# Patient Record
Sex: Male | Born: 2000 | Race: Black or African American | Hispanic: No | Marital: Single | Smoking: Never smoker
Health system: Southern US, Community
[De-identification: ages and names within clinical notes are randomized; demographics above are authoritative.]

## PROBLEM LIST (undated history)

## (undated) DIAGNOSIS — E663 Overweight: Secondary | ICD-10-CM

## (undated) DIAGNOSIS — E739 Lactose intolerance, unspecified: Secondary | ICD-10-CM

## (undated) DIAGNOSIS — Z9109 Other allergy status, other than to drugs and biological substances: Secondary | ICD-10-CM

## (undated) HISTORY — DX: Lactose intolerance, unspecified: E73.9

## (undated) HISTORY — DX: Other allergy status, other than to drugs and biological substances: Z91.09

## (undated) HISTORY — DX: Overweight: E66.3

## (undated) HISTORY — PX: TONSILLECTOMY: SUR1361

---
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
## 2022-03-12 ENCOUNTER — Encounter: Payer: Self-pay | Admitting: Emergency Medicine

## 2022-03-12 ENCOUNTER — Ambulatory Visit
Admission: EM | Admit: 2022-03-12 | Discharge: 2022-03-12 | Disposition: A | Discharge status: Home / Self Care | Payer: Medicaid Other | Attending: Family Medicine | Admitting: Family Medicine

## 2022-03-12 DIAGNOSIS — H65193 Other acute nonsuppurative otitis media, bilateral: Secondary | ICD-10-CM

## 2022-03-12 MED ORDER — PREDNISONE 50 MG PO TABS: ORAL_TABLET | ORAL | 0 refills | Status: DC

## 2022-03-12 MED ORDER — PSEUDOEPHEDRINE HCL ER 120 MG PO TB12: 120.0000 mg | ORAL_TABLET | Freq: Two times a day (BID) | ORAL | 0 refills | Status: DC | PRN

## 2022-03-12 MED ORDER — FLUTICASONE PROPIONATE 50 MCG/ACT NA SUSP: 1.0000 | Freq: Two times a day (BID) | NASAL | 2 refills | Status: DC

## 2022-03-12 NOTE — ED Triage Notes (Signed)
Right ear pain since Sunday.  States can't hear our of left ear today.   ?

## 2022-03-12 NOTE — ED Provider Notes (Signed)
?[facility] ? ? ? ?CSN: [id number] ?Arrival date & time: 03/12/22  [id number] ? ? ?  ? ?History   ?Chief Complaint ?No chief complaint on file. ? ? ?HPI ?Antonio [name] is a 21 y.o. male.  ? ?Presenting today with bilateral ear pain, pressure, muffled hearing for the past 2 days.  Denies significant congestion, sore throat, cough, ear drainage, fevers.  Not trying anything over-the-counter for symptoms.  History of seasonal allergies not currently on anything for this. ? ? ?Past Medical History:  ?Diagnosis Date  ? Environmental allergies 03/25/2013  ? Overweight 05/25/2013  ? ? ?Patient Active Problem List  ? Diagnosis Date Noted  ? Encounter for well child check without abnormal findings 02/23/2018  ? Overweight 05/25/2013  ? Environmental allergies 03/25/2013  ? ? ?Past Surgical History:  ?Procedure Laterality Date  ? TONSILLECTOMY    ? ? ? ? ? ?Home Medications   ? ?Prior to Admission medications   ?Medication Sig Start Date End Date Taking? Authorizing Provider  ?fluticasone (FLONASE) 50 MCG/ACT nasal spray Place 1 spray into both nostrils 2 (two) times daily. 03/12/22  Yes Volney American, PA-C  ?predniSONE (DELTASONE) 50 MG tablet Take 1 tablet with breakfast daily x  days 03/12/22  Yes Volney American, PA-C  ?pseudoephedrine (SUDAFED 12 HOUR) 120 MG 12 hr tablet Take 1 tablet (120 mg total) by mouth every 12 (twelve) hours as needed for congestion. 03/12/22  Yes Volney American, PA-C  ?albuterol (PROVENTIL HFA;VENTOLIN HFA) 108 (90 Base) MCG/ACT inhaler Inhale 2 puffs into the lungs every 4 (four) hours as needed for wheezing or shortness of breath (or coughing). [id number]   Delora Fuel, MD  ?amoxicillin (AMOXIL) 875 MG tablet Take 1 tablet (875 mg total) by mouth 2 (two) times daily. 09/03/21   Jaynee Eagles, PA-C  ?benzonatate (TESSALON) 100 MG capsule Take 1 capsule (100 mg total) by mouth every 8 (eight) hours. 08/18/20   Emerson Monte, FNP  ?cetirizine (ZYRTEC ALLERGY) 10 MG tablet  Take 1 tablet (10 mg total) by mouth daily. 09/03/21   Jaynee Eagles, PA-C  ?fluticasone (FLONASE) 50 MCG/ACT nasal spray Place 1 spray into both nostrils daily for 14 days. 08/18/20 09/01/20  Emerson Monte, FNP  ?loperamide (IMODIUM) 2 MG capsule Take 1 capsule (2 mg total) by mouth 2 (two) times daily as needed for diarrhea or loose stools. 09/17/21   Jaynee Eagles, PA-C  ?ondansetron (ZOFRAN-ODT) 8 MG disintegrating tablet Take 1 tablet (8 mg total) by mouth every 8 (eight) hours as needed for nausea or vomiting. 09/17/21   Jaynee Eagles, PA-C  ?pseudoephedrine (SUDAFED) 30 MG tablet Take 1 tablet (30 mg total) by mouth every 8 (eight) hours as needed for congestion. 09/03/21   Jaynee Eagles, PA-C  ? ? ?Family History ?Family History  ?Problem Relation Age of Onset  ? Healthy Mother   ? Healthy Father   ? Healthy Sister   ? Hypertension Paternal Grandmother   ? Cancer Paternal Grandfather   ?     prostate  ? ? ?Social History ?Social History  ? ?Tobacco Use  ? Smoking status: Never  ?  Passive exposure: Yes  ? Smokeless tobacco: Never  ?Vaping Use  ? Vaping Use: Never used  ?Substance Use Topics  ? Alcohol use: No  ? Drug use: No  ? ? ? ?Allergies   ?Patient has no known allergies. ? ? ?Review of Systems ?Review of Systems ?Per HPI ? ?Physical Exam ?Triage Vital  Signs ?ED Triage Vitals  ?Enc Vitals Group  ?   BP 03/12/22 0948 (!) 135/91  ?   Pulse Rate 03/12/22 0948 (!) 57  ?   Resp 03/12/22 0948 18  ?   Temp 03/12/22 0948 98.2 ?F (36.8 ?C)  ?   Temp Source 03/12/22 0948 Oral  ?   SpO2 03/12/22 0948 98 %  ?   Weight --   ?   Height --   ?   Head Circumference --   ?   Peak Flow --   ?   Pain Score 03/12/22 0949 0  ?   Pain Loc --   ?   Pain Edu? --   ?   Excl. in [place]? --   ? ?No data found. ? ?Updated Vital Signs ?BP (!) 135/91 (BP Location: Right Arm)   Pulse (!) 57   Temp 98.2 ?F (36.8 ?C) (Oral)   Resp 18   SpO2 98%  ? ?Visual Acuity ?Right Eye Distance:   ?Left Eye Distance:   ?Bilateral Distance:   ? ?Right Eye  Near:   ?Left Eye Near:    ?Bilateral Near:    ? ?Physical Exam ?Vitals and nursing note reviewed.  ?Constitutional:   ?   Appearance: He is well-developed.  ?HENT:  ?   Head: Atraumatic.  ?   Right Ear: External ear normal.  ?   Left Ear: External ear normal.  ?   Ears:  ?   Comments: Bilateral middle ear effusion ?   Nose: Rhinorrhea present.  ?   Mouth/Throat:  ?   Mouth: Mucous membranes are moist.  ?   Pharynx: No oropharyngeal exudate.  ?Eyes:  ?   Conjunctiva/sclera: Conjunctivae normal.  ?   Pupils: Pupils are equal, round, and reactive to light.  ?Cardiovascular:  ?   Rate and Rhythm: Normal rate and regular rhythm.  ?Pulmonary:  ?   Effort: Pulmonary effort is normal. No respiratory distress.  ?   Breath sounds: No wheezing or rales.  ?Musculoskeletal:     ?   General: Normal range of motion.  ?   Cervical back: Normal range of motion and neck supple.  ?Lymphadenopathy:  ?   Cervical: No cervical adenopathy.  ?Skin: ?   General: Skin is warm and dry.  ?Neurological:  ?   Mental Status: He is alert and oriented to person, place, and time.  ?Psychiatric:     ?   Behavior: Behavior normal.  ? ?UC Treatments / Results  ?Labs ?(all labs ordered are listed, but only abnormal results are displayed) ?Labs Reviewed - No data to display ? ?EKG ? ? ?Radiology ?No results found. ? ?Procedures ?Procedures (including critical care time) ? ?Medications Ordered in UC ?Medications - No data to display ? ?Initial Impression / Assessment and Plan / UC Course  ?I have reviewed the triage vital signs and the nursing notes. ? ?Pertinent labs & imaging results that were available during my care of the patient were reviewed by me and considered in my medical decision making (see chart for details). ? ?  ? ?Treat with short burst of prednisone, Sudafed, Flonase and discussed importance of daily allergy regimen.  Return for acutely worsening symptoms. ? ?Final Clinical Impressions(s) / UC Diagnoses  ? ?Final diagnoses:  ?Acute MEE  (middle ear effusion), bilateral  ? ?Discharge Instructions   ?None ?  ? ?ED Prescriptions   ? ? Medication Sig Dispense Auth. Provider  ? predniSONE (DELTASONE) 50  MG tablet Take 1 tablet with breakfast daily x  days 3 tablet Volney American, PA-C  ? fluticasone (FLONASE) 50 MCG/ACT nasal spray Place 1 spray into both nostrils 2 (two) times daily. 16 g Volney American, [place]  ? pseudoephedrine (SUDAFED 12 HOUR) 120 MG 12 hr tablet Take 1 tablet (120 mg total) by mouth every 12 (twelve) hours as needed for congestion. 20 tablet Volney American, [place]  ? ?  ? ?PDMP not reviewed this encounter. ?  ?Volney American, PA-C ?03/12/22 1016 ? ?

## 2022-03-21 ENCOUNTER — Ambulatory Visit (INDEPENDENT_AMBULATORY_CARE_PROVIDER_SITE_OTHER): Payer: Medicaid Other | Admitting: Pediatrics

## 2022-03-21 ENCOUNTER — Encounter: Payer: Self-pay | Admitting: Pediatrics

## 2022-03-21 VITALS — BP 114/76 | Ht 62.8 in | Wt 185.2 lb

## 2022-03-21 DIAGNOSIS — Z68.41 Body mass index (BMI) pediatric, greater than or equal to 95th percentile for age: Secondary | ICD-10-CM

## 2022-03-21 DIAGNOSIS — E739 Lactose intolerance, unspecified: Secondary | ICD-10-CM

## 2022-03-21 DIAGNOSIS — Z0001 Encounter for general adult medical examination with abnormal findings: Secondary | ICD-10-CM

## 2022-03-21 DIAGNOSIS — Z113 Encounter for screening for infections with a predominantly sexual mode of transmission: Secondary | ICD-10-CM | POA: Diagnosis not present

## 2022-03-21 NOTE — Patient Instructions (Addendum)
Preventive Care 19-19 Years Old, Male ?Preventive care refers to lifestyle choices and visits with your health care provider that can promote health and wellness. At this stage in your life, you may start seeing a primary care physician instead of a pediatrician for your preventive care. Preventive care visits are also called wellness exams. ?What can I expect for my preventive care visit? ?Counseling ?During your preventive care visit, your health care provider may ask about your: ?Medical history, including: ?Past medical problems. ?Family medical history. ?Current health, including: ?Home life and relationship well-being. ?Emotional well-being. ?Sexual activity and sexual health. ?Lifestyle, including: ?Alcohol, nicotine or tobacco, and drug use. ?Access to firearms. ?Diet, exercise, and sleep habits. ?Sunscreen use. ?Motor vehicle safety. ?Physical exam ?Your health care provider may check your: ?Height and weight. These may be used to calculate your BMI (body mass index). BMI is a measurement that tells if you are at a healthy weight. ?Waist circumference. This measures the distance around your waistline. This measurement also tells if you are at a healthy weight and may help predict your risk of certain diseases, such as type 2 diabetes and high blood pressure. ?Heart rate and blood pressure. ?Body temperature. ?Skin for abnormal spots. ?What immunizations do I need? ? ?Vaccines are usually given at various ages, according to a schedule. Your health care provider will recommend vaccines for you based on your age, medical history, and lifestyle or other factors, such as travel or where you work. ?What tests do I need? ?Screening ?Your health care provider may recommend screening tests for certain conditions. This may include: ?Vision and hearing tests. ?Lipid and cholesterol levels. ?Hepatitis B test. ?Hepatitis C test. ?HIV (human immunodeficiency virus) test. ?STI (sexually transmitted infection) testing, if  you are at risk. ?Tuberculosis skin test. ?Talk with your health care provider about your test results, treatment options, and if necessary, the need for more tests. ?Follow these instructions at home: ?Eating and drinking ? ?Eat a healthy diet that includes fresh fruits and vegetables, whole grains, lean protein, and low-fat dairy products. ?Drink enough fluid to keep your urine pale yellow. ?Do not drink alcohol if: ?Your health care provider tells you not to drink. ?You are under the legal drinking age. In the [place] the legal drinking age is 60. ?If you drink alcohol: ?Limit how much you have to 0-2 drinks a day. ?Know how much alcohol is in your drink. In the [place] one drink equals one 12 oz bottle of beer (355 mL), one 5 oz glass of wine (148 mL), or one 1? oz glass of hard liquor (44 mL). ?Lifestyle ?Brush your teeth every morning and night with fluoride toothpaste. Floss one time each day. ?Exercise for at least 30 minutes 5 or more days of the week. ?Do not use any products that contain nicotine or tobacco. These products include cigarettes, chewing tobacco, and vaping devices, such as e-cigarettes. If you need help quitting, ask your health care provider. ?Do not use drugs. ?If you are sexually active, practice safe sex. Use a condom or other form of protection to prevent STIs. ?Find healthy ways to manage stress, such as: ?Meditation, yoga, or listening to music. ?Journaling. ?Talking to a trusted person. ?Spending time with friends and family. ?Safety ?Always wear your seat belt while driving or riding in a vehicle. ?Do not drive: ?If you have been driLactose Intolerance, Adult ?Lactose is a natural sugar that is found in dairy milk and dairy products such as cheese and yogurt. Lactose is  digested by lactase, a protein in your small intestine. Some people do not produce enough lactase to digest lactose. This is called lactose intolerance. Lactose intolerance is different from milk allergy, which is a more  serious reaction to the protein in milk. ?What are the causes? ?Causes of lactose intolerance may include: ?Normal aging. The ability to produce lactase may lessen with age, causing lactose intolerance over time. ?Being born without the ability to make lactase. ?Digestive diseases such as gastroenteritis or inflammatory bowel disease (IBD). ?Surgery or injury to your small intestine. ?Infection in your intestines. ?Certain antibiotic medicines and cancer treatments. ?What are the signs or symptoms? ?Lactose intolerance can cause discomfort within 30 minutes to 2 hours after you eat or drink something that contains lactose. Symptoms may include: ?Nausea. ?Diarrhea. ?Cramps or pain in the abdomen. ?Bloating. You may have a full, tight, or painful feeling in the abdomen. ?Gas. ?How is this diagnosed? ?This condition may be diagnosed based on: ?Your symptoms and medical history. ?Lactose tolerance test. This test involves drinking a lactose solution and then having blood tests to measure the amount of glucose in your blood. If your blood glucose level does not go up, it means your body is not able to digest the lactose. ?Lactose breath test (hydrogen breath test). This test involves drinking a lactose solution and then exhaling into a type of bag while you digest the solution. Having a lot of hydrogen in your breath can be a sign of lactose intolerance. ?How is this treated? ?There is no treatment to improve your body's ability to produce lactase. However, you can manage your symptoms at home by: ?Limiting or avoiding dairy milk, dairy products, and other sources of lactose. ?Taking lactase tablets when you eat or drink milk products. Lactase tablets are over-the-counter medicines that help to improve lactose digestion. You may also add lactase drops to regular milk. ?Adjusting your diet, such as drinking lactose-free milk. ?Lactose tolerance varies from person to person. Some people may be able to eat or drink small  amounts of products that contain lactose, and other people may need to avoid all foods and drinks that contain lactose. Talk with your health care provider about what treatment is best for you. ?Follow these instructions at home: ?Limit or avoid foods, beverages, and medicines that contain lactose, as told by your health care provider. ?Keep track of which foods, beverages, or medicines cause symptoms so you can decide what to avoid in the future. ?Read food and medicine labels carefully. Avoid products that contain: ?Lactose. ?Milk solids. ?Casein. ?Whey. ?Take over-the-counter and prescription medicines (including lactase tablets) only as told by your health care provider. ?If you stop eating and drinking dairy products, make sure to get enough protein, calcium, and vitamin D from other foods. Work with your health care provider or a dietitian to make sure you get enough of those nutrients. ?Choose a milk substitute that has been fortified with vitamin D or calcium. Fortified means that vitamin D or calcium has been added to the product. ?Soy milk contains high-quality protein. ?Milks that are made from nuts or grains contain very small amounts of protein. These include almond milk and rice milk. ?Keep all follow-up visits. This is important. ?Contact a health care provider if: ?You have no relief from your symptoms after you have eliminated milk products and other sources of lactose. ?Get help right away if: ?You have blood in your stool (feces). ?You have severe abdominal pain. ?Summary ?Lactose is a natural sugar  that is found in dairy milk and dairy products such as cheese and yogurt. Lactose is digested by lactase, which is a protein in the small intestine. ?Some people do not produce enough lactase to digest lactose. This is called lactose intolerance. ?Lactose intolerance can cause discomfort within 30 minutes to 2 hours after you eat or drink something that contains lactose. ?Limit or avoid foods,  beverages, and medicines that contain lactose, as told by your health care provider. ?This information is not intended to replace advice given to you by your health care provider. Make sure you discuss any que

## 2022-03-21 NOTE — Progress Notes (Signed)
Adolescent Well Care Visit ?Antonio [name] is a 21 y.o. male who is here for well care. ?   ?PCP:  Fransisca Connors, MD ? ? History was provided by the patient. ? ?Current Issues: ?Current concerns include  abdominal pain for years. Since he was "little". He states that the pain will vary. Sometimes he does have problems with having a bowel movement. He will often have hard stools. He states that he does drink a lot of sodas, eat a lot of spicy foods, etc.   ? ?Nutrition: ?Nutrition/Eating Behaviors: trying to improve diet  ?Adequate calcium in diet?: yes  ?Supplements/ Vitamins:  no  ? ?Exercise/ Media: ?Play any Sports?/ Exercise: yes  ?Screen Time:  > 2 hours-counseling provided ? ? ?Sleep:  ?Sleep: normal  ? ?Social Screening: ?Lives with:  parents  ?Parental relations:  good ?Activities, Work, and Chores?: yes ? ?Education: ?School Grade: graduated from high school last year, 2022  ? ?Menstruation:   ?No LMP for male patient. ?Menstrual History: n/a ? ?Safe at home, in school & in relationships?  Yes ?Safe to self?  Yes  ? ?Screenings: ?Patient has a dental home: yes ? ?PHQ-9 completed and results indicated . ? ?  03/21/2022  ? 11:23 AM 02/23/2018  ? 10:52 PM  ?Depression screen PHQ 2/9  ?Decreased Interest 1 0  ?Down, Depressed, Hopeless 1 0  ?PHQ - 2 Score 2 0  ?Altered sleeping 1 1  ?Tired, decreased energy 0 0  ?Change in appetite 0 0  ?Feeling bad or failure about yourself  0 0  ?Trouble concentrating 0 1  ?Moving slowly or fidgety/restless 0 0  ?Suicidal thoughts 0 0  ?PHQ-9 Score 3 2  ?Difficult doing work/chores Not difficult at all Somewhat difficult  ? ? ? ?Physical Exam:  ?Vitals:  ? 03/21/22 1014  ?BP: 114/76  ?Weight: 185 lb 4 oz (84 kg)  ?Height: 5' 2.8" (1.595 m)  ? ?BP 114/76   Ht 5' 2.8" (1.595 m)   Wt 185 lb 4 oz (84 kg)   BMI 33.03 kg/m?  ?Body mass index: body mass index is 33.03 kg/m?. ?Blood pressure percentiles are not available for patients who are 18 years or older. ? ?Hearing  Screening  ? 500Hz  1000Hz  2000Hz  3000Hz  4000Hz   ?Right ear 20 20 20 20 20   ?Left ear 20 20 20 20 20   ? ?Vision Screening  ? Right eye Left eye Both eyes  ?Without correction 20/20 20/20 20/20   ?With correction     ? ? ?General Appearance:   alert, oriented, no acute distress  ?HENT: Normocephalic, no obvious abnormality, conjunctiva clear  ?Mouth:   Normal appearing teeth, no obvious discoloration, dental caries, or dental caps  ?Neck:   Supple; thyroid: no enlargement, symmetric, no tenderness/mass/nodules  ?Chest Normal   ?Lungs:   Clear to auscultation bilaterally, normal work of breathing  ?Heart:   Regular rate and rhythm, S1 and S2 normal, no murmurs;   ?Abdomen:   Soft, non-tender, no mass, or organomegaly  ?GU normal male genitals, no testicular masses or hernia  ?Musculoskeletal:   Tone and strength strong and symmetrical, all extremities             ?  ?Lymphatic:   No cervical adenopathy  ?Skin/Hair/Nails:   Skin warm, dry and intact, no rashes, no bruises or petechiae  ?Neurologic:   Strength, gait, and coordination normal and age-appropriate  ? ? ? ?Assessment and Plan:  ? ?.1. Screening examination for  venereal disease ?- C. trachomatis/N. gonorrhoeae RNA ? ?2. Encounter for general adult medical examination with abnormal findings ?Since patient is not able to receive Men B#2 at our clinic today, MD discussed receiving 2nd Men B at new adult doctor's clinic or local health dept  ? ?3. Body mass index, pediatric, greater than or equal to 95th percentile for age ? ? ?4. Lactose intolerance ?Discussed possible causes of abdominal pain  ?Take daily MVI with Vit D and calcium  ?Discussed healthier diet and foods/drinks to avoid  ? ? ?BMI is not appropriate for age ? ?Hearing screening result:normal ?Vision screening result: normal ? ?Counseling provided for all of the vaccine components  ?Orders Placed This Encounter  ?Procedures  ? C. trachomatis/N. gonorrhoeae RNA  ? ?MD discussed with patient information  for adult doctors today in clinic, patient given a list  ?  ?Return for Please provide aged out information, patient is 20 years old . ? ?Fransisca Connors, MD ? ? ? ?

## 2022-03-22 LAB — C. TRACHOMATIS/N. GONORRHOEAE RNA
C. trachomatis RNA, TMA: NOT DETECTED
N. gonorrhoeae RNA, TMA: NOT DETECTED

## 2022-04-11 ENCOUNTER — Encounter: Payer: Self-pay | Admitting: Emergency Medicine

## 2022-04-11 ENCOUNTER — Ambulatory Visit
Admission: EM | Admit: 2022-04-11 | Discharge: 2022-04-11 | Disposition: A | Discharge status: Home / Self Care | Payer: Medicaid Other | Attending: Nurse Practitioner | Admitting: Nurse Practitioner

## 2022-04-11 DIAGNOSIS — Z20822 Contact with and (suspected) exposure to covid-19: Secondary | ICD-10-CM

## 2022-04-11 DIAGNOSIS — R6889 Other general symptoms and signs: Secondary | ICD-10-CM | POA: Diagnosis not present

## 2022-04-11 MED ORDER — ONDANSETRON 4 MG PO TBDP: 4.0000 mg | ORAL_TABLET | Freq: Three times a day (TID) | ORAL | 0 refills | Status: DC | PRN

## 2022-04-11 NOTE — ED Provider Notes (Signed)
[facility]    CSN: [id number] Arrival date & time: 04/11/22  [id number]      History   Chief Complaint No chief complaint on file.   HPI Antonio [name] is a 21 y.o. male.   HPI Patient presents for flulike symptoms that started last evening.  He complains of chills, headache, diarrhea, nausea, and body aches.  He reports 3 episodes of diarrhea since his symptoms started.  He denies sore throat, cough, abdominal pain, or vomiting.  States he has been taking Tylenol for his symptoms.  Last dose of Tylenol was around 9 PM last evening.  He denies any sick contacts.  Has not received his COVID or influenza vaccines.  Past Medical History:  Diagnosis Date   Environmental allergies 03/25/2013   Lactose intolerance     Patient Active Problem List   Diagnosis Date Noted   Lactose intolerance 03/21/2022   Encounter for well child check without abnormal findings 02/23/2018   Overweight 05/25/2013   Environmental allergies 03/25/2013    Past Surgical History:  Procedure Laterality Date   TONSILLECTOMY         Home Medications    Prior to Admission medications   Medication Sig Start Date End Date Taking? Authorizing Provider  ondansetron (ZOFRAN-ODT) 4 MG disintegrating tablet Take 1 tablet (4 mg total) by mouth every 8 (eight) hours as needed for nausea or vomiting. 04/11/22  Yes Aseel Uhde-Warren, Alda Lea, NP  albuterol (PROVENTIL HFA;VENTOLIN HFA) 108 (90 Base) MCG/ACT inhaler Inhale 2 puffs into the lungs every 4 (four) hours as needed for wheezing or shortness of breath (or coughing). [id number]   Delora Fuel, MD  amoxicillin (AMOXIL) 875 MG tablet Take 1 tablet (875 mg total) by mouth 2 (two) times daily. 09/03/21   Jaynee Eagles, PA-C  benzonatate (TESSALON) 100 MG capsule Take 1 capsule (100 mg total) by mouth every 8 (eight) hours. 08/18/20   Avegno, Darrelyn Hillock, FNP  cetirizine (ZYRTEC ALLERGY) 10 MG tablet Take 1 tablet (10 mg total) by mouth daily. 09/03/21   Jaynee Eagles, PA-C  fluticasone (FLONASE) 50 MCG/ACT nasal spray Place 1 spray into both nostrils daily for 14 days. 08/18/20 09/01/20  Avegno, Darrelyn Hillock, FNP  fluticasone (FLONASE) 50 MCG/ACT nasal spray Place 1 spray into both nostrils 2 (two) times daily. 03/12/22   Volney American, PA-C  loperamide (IMODIUM) 2 MG capsule Take 1 capsule (2 mg total) by mouth 2 (two) times daily as needed for diarrhea or loose stools. 09/17/21   Jaynee Eagles, PA-C  predniSONE (DELTASONE) 50 MG tablet Take 1 tablet with breakfast daily x  days 03/12/22   Volney American, PA-C  pseudoephedrine (SUDAFED 12 HOUR) 120 MG 12 hr tablet Take 1 tablet (120 mg total) by mouth every 12 (twelve) hours as needed for congestion. 03/12/22   Volney American, PA-C  pseudoephedrine (SUDAFED) 30 MG tablet Take 1 tablet (30 mg total) by mouth every 8 (eight) hours as needed for congestion. 09/03/21   Jaynee Eagles, PA-C    Family History Family History  Problem Relation Age of Onset   Healthy Mother    Healthy Father    Healthy Sister    Hypertension Paternal Grandmother    Cancer Paternal Grandfather        prostate    Social History Social History   Tobacco Use   Smoking status: Never    Passive exposure: Yes   Smokeless tobacco: Never  Vaping Use   Vaping Use: Never  used  Substance Use Topics   Alcohol use: No   Drug use: No     Allergies   Patient has no known allergies.   Review of Systems Review of Systems Per HPI  Physical Exam Triage Vital Signs ED Triage Vitals  Enc Vitals Group     BP 04/11/22 0845 116/79     Pulse Rate 04/11/22 0845 93     Resp 04/11/22 0845 18     Temp 04/11/22 0845 99.2 F (37.3 C)     Temp Source 04/11/22 0845 Oral     SpO2 04/11/22 0845 97 %     Weight --      Height --      Head Circumference --      Peak Flow --      Pain Score 04/11/22 0846 8     Pain Loc --      Pain Edu? --      Excl. in [place]? --    No data found.  Updated Vital Signs BP 116/79  (BP Location: Right Arm)   Pulse 93   Temp 99.2 F (37.3 C) (Oral)   Resp 18   SpO2 97%   Visual Acuity Right Eye Distance:   Left Eye Distance:   Bilateral Distance:    Right Eye Near:   Left Eye Near:    Bilateral Near:     Physical Exam Vitals and nursing note reviewed.  Constitutional:      General: He is not in acute distress.    Appearance: He is well-developed.  HENT:     Head: Normocephalic and atraumatic.     Right Ear: Tympanic membrane, ear canal and external ear normal.     Left Ear: Tympanic membrane, ear canal and external ear normal.     Nose: Nose normal.     Mouth/Throat:     Mouth: Mucous membranes are moist.  Eyes:     Conjunctiva/sclera: Conjunctivae normal.  Cardiovascular:     Rate and Rhythm: Normal rate and regular rhythm.     Pulses: Normal pulses.     Heart sounds: Normal heart sounds. No murmur heard. Pulmonary:     Effort: Pulmonary effort is normal. No respiratory distress.     Breath sounds: Normal breath sounds.  Abdominal:     General: Bowel sounds are normal.     Palpations: Abdomen is soft.     Tenderness: There is no abdominal tenderness.  Musculoskeletal:        General: No swelling.     Cervical back: Neck supple.  Skin:    General: Skin is warm and dry.     Capillary Refill: Capillary refill takes less than 2 seconds.  Neurological:     General: No focal deficit present.     Mental Status: He is alert and oriented to person, place, and time.  Psychiatric:        Mood and Affect: Mood normal.        Behavior: Behavior normal.     UC Treatments / Results  Labs (all labs ordered are listed, but only abnormal results are displayed) Labs Reviewed  COVID-19, FLU A+B NAA    EKG   Radiology No results found.  Procedures Procedures (including critical care time)  Medications Ordered in UC Medications - No data to display  Initial Impression / Assessment and Plan / UC Course  I have reviewed the triage vital signs  and the nursing notes.  Pertinent labs & imaging results  that were available during my care of the patient were reviewed by me and considered in my medical decision making (see chart for details).  Patient presents for flulike symptoms that started within the past 24 hours.  His exam is reassuring, vital signs are stable at this time.  COVID/flu test are pending at this time.  Patient advised that supportive care was going to be important for him at this time to include increasing fluids and getting plenty of rest.  We will provide a prescription for Zofran to help for his nausea.  Also recommended continued use of ibuprofen or Tylenol to help with pain or fever.  Patient advised he will be contacted if the COVID/flu test is positive.  Follow-up if symptoms do not improve. Final Clinical Impressions(s) / UC Diagnoses   Final diagnoses:  Exposure to COVID-19 virus  Flu-like symptoms     Discharge Instructions      COVID and flu test are pending.  If your results are positive, you will be contacted.  If you have access to MyChart, you will be able to see the results there. Increase fluids and allow for plenty of rest. Continue to take Tylenol or ibuprofen as needed for pain or fever. I provided prescription for you to help with your nausea.  You should try to eat and drink.  Recommend starting with clear liquids such as water, soups, broths, and then advancing to a regular diet as tolerated. Follow-up if your symptoms do not improve.     ED Prescriptions     Medication Sig Dispense Auth. Provider   ondansetron (ZOFRAN-ODT) 4 MG disintegrating tablet Take 1 tablet (4 mg total) by mouth every 8 (eight) hours as needed for nausea or vomiting. 20 tablet Kirby Cortese-Warren, Alda Lea, NP      PDMP not reviewed this encounter.   Tish Men, NP 04/11/22 [phone]

## 2022-04-11 NOTE — ED Triage Notes (Signed)
Chills, headache, diarrhea, nausea and body aches since last night

## 2022-04-11 NOTE — Discharge Instructions (Addendum)
COVID and flu test are pending.  If your results are positive, you will be contacted.  If you have access to MyChart, you will be able to see the results there. Increase fluids and allow for plenty of rest. Continue to take Tylenol or ibuprofen as needed for pain or fever. I provided prescription for you to help with your nausea.  You should try to eat and drink.  Recommend starting with clear liquids such as water, soups, broths, and then advancing to a regular diet as tolerated. Follow-up if your symptoms do not improve.

## 2022-04-12 LAB — COVID-19, FLU A+B NAA
Influenza A, NAA: NOT DETECTED
Influenza B, NAA: NOT DETECTED
SARS-CoV-2, NAA: NOT DETECTED

## 2023-01-13 ENCOUNTER — Ambulatory Visit
Admission: EM | Admit: 2023-01-13 | Discharge: 2023-01-13 | Disposition: A | Discharge status: Home / Self Care | Payer: BC Managed Care – PPO | Attending: Family Medicine | Admitting: Family Medicine

## 2023-01-13 DIAGNOSIS — J069 Acute upper respiratory infection, unspecified: Secondary | ICD-10-CM | POA: Insufficient documentation

## 2023-01-13 DIAGNOSIS — Z1152 Encounter for screening for COVID-19: Secondary | ICD-10-CM | POA: Diagnosis not present

## 2023-01-13 LAB — POCT RAPID STREP A (OFFICE): Rapid Strep A Screen: NEGATIVE

## 2023-01-13 MED ORDER — LIDOCAINE VISCOUS HCL 2 % MT SOLN: 10.0000 mL | OROMUCOSAL | 0 refills | Status: DC | PRN

## 2023-01-13 NOTE — ED Triage Notes (Signed)
Sore throat and neck pain, runny nose, cough that started a week ago. Taking dyquil.

## 2023-01-13 NOTE — ED Provider Notes (Signed)
[facility]    CSN: [id number] Arrival date & time: 01/13/23  1020      History   Chief Complaint Chief Complaint  Patient presents with   Sore Throat   Cough    HPI Antonio [name] is a 22 y.o. male.   Patient presenting today with about a week of runny nose, scratchy throat, cough, slight neck tenderness in the lymph nodes.  Denies fever, chills, body aches, difficulty breathing or swallowing, chest pain, shortness of breath, abdominal pain, nausea vomiting or diarrhea.  Took a dose of DayQuil last night, otherwise has not tried anything throughout the duration.  No known sick contacts or pertinent chronic medical problems per patient.    Past Medical History:  Diagnosis Date   Environmental allergies 03/25/2013   Lactose intolerance     Patient Active Problem List   Diagnosis Date Noted   Lactose intolerance 03/21/2022   Encounter for well child check without abnormal findings 02/23/2018   Overweight 05/25/2013   Environmental allergies 03/25/2013    Past Surgical History:  Procedure Laterality Date   TONSILLECTOMY         Home Medications    Prior to Admission medications   Medication Sig Start Date End Date Taking? Authorizing Provider  cetirizine (ZYRTEC ALLERGY) 10 MG tablet Take 1 tablet (10 mg total) by mouth daily. 09/03/21  Yes Jaynee Eagles, PA-C  lidocaine (XYLOCAINE) 2 % solution Use as directed 10 mLs in the mouth or throat every 3 (three) hours as needed for mouth pain. 01/13/23  Yes Volney American, PA-C  albuterol (PROVENTIL HFA;VENTOLIN HFA) 108 (90 Base) MCG/ACT inhaler Inhale 2 puffs into the lungs every 4 (four) hours as needed for wheezing or shortness of breath (or coughing). Q000111Q   Delora Fuel, MD  amoxicillin (AMOXIL) 875 MG tablet Take 1 tablet (875 mg total) by mouth 2 (two) times daily. 09/03/21   Jaynee Eagles, PA-C  benzonatate (TESSALON) 100 MG capsule Take 1 capsule (100 mg total) by mouth every 8 (eight) hours.  08/18/20   Avegno, Darrelyn Hillock, FNP  fluticasone (FLONASE) 50 MCG/ACT nasal spray Place 1 spray into both nostrils daily for 14 days. 08/18/20 09/01/20  Avegno, Darrelyn Hillock, FNP  fluticasone (FLONASE) 50 MCG/ACT nasal spray Place 1 spray into both nostrils 2 (two) times daily. 03/12/22   Volney American, PA-C  loperamide (IMODIUM) 2 MG capsule Take 1 capsule (2 mg total) by mouth 2 (two) times daily as needed for diarrhea or loose stools. 09/17/21   Jaynee Eagles, PA-C  ondansetron (ZOFRAN-ODT) 4 MG disintegrating tablet Take 1 tablet (4 mg total) by mouth every 8 (eight) hours as needed for nausea or vomiting. 04/11/22   Leath-Warren, Alda Lea, NP  predniSONE (DELTASONE) 50 MG tablet Take 1 tablet with breakfast daily x  days 03/12/22   Volney American, PA-C  pseudoephedrine (SUDAFED 12 HOUR) 120 MG 12 hr tablet Take 1 tablet (120 mg total) by mouth every 12 (twelve) hours as needed for congestion. 03/12/22   Volney American, PA-C  pseudoephedrine (SUDAFED) 30 MG tablet Take 1 tablet (30 mg total) by mouth every 8 (eight) hours as needed for congestion. 09/03/21   Jaynee Eagles, PA-C    Family History Family History  Problem Relation Age of Onset   Healthy Mother    Healthy Father    Healthy Sister    Hypertension Paternal Grandmother    Cancer Paternal Grandfather        prostate  Social History Social History   Tobacco Use   Smoking status: Never    Passive exposure: Yes   Smokeless tobacco: Never  Vaping Use   Vaping Use: Never used  Substance Use Topics   Alcohol use: No   Drug use: No     Allergies   Patient has no known allergies.   Review of Systems Review of Systems PER HPI  Physical Exam Triage Vital Signs ED Triage Vitals  Enc Vitals Group     BP 01/13/23 1128 (!) 144/83     Pulse Rate 01/13/23 1128 66     Resp 01/13/23 1128 16     Temp 01/13/23 1128 98.1 F (36.7 C)     Temp Source 01/13/23 1128 Oral     SpO2 --      Weight --      Height --       Head Circumference --      Peak Flow --      Pain Score 01/13/23 1131 0     Pain Loc --      Pain Edu? --      Excl. in [place]? --    No data found.  Updated Vital Signs BP (!) 144/83 (BP Location: Right Arm)   Pulse 66   Temp 98.1 F (36.7 C) (Oral)   Resp 16   Visual Acuity Right Eye Distance:   Left Eye Distance:   Bilateral Distance:    Right Eye Near:   Left Eye Near:    Bilateral Near:     Physical Exam Vitals and nursing note reviewed.  Constitutional:      Appearance: He is well-developed.  HENT:     Head: Atraumatic.     Right Ear: External ear normal.     Left Ear: External ear normal.     Nose: Nose normal.     Mouth/Throat:     Mouth: Mucous membranes are moist.     Pharynx: Oropharynx is clear. Posterior oropharyngeal erythema present. No oropharyngeal exudate.  Eyes:     Conjunctiva/sclera: Conjunctivae normal.     Pupils: Pupils are equal, round, and reactive to light.  Cardiovascular:     Rate and Rhythm: Normal rate and regular rhythm.  Pulmonary:     Effort: Pulmonary effort is normal. No respiratory distress.     Breath sounds: No wheezing or rales.  Musculoskeletal:        General: Normal range of motion.     Cervical back: Normal range of motion and neck supple.  Lymphadenopathy:     Cervical: No cervical adenopathy.  Skin:    General: Skin is warm and dry.  Neurological:     Mental Status: He is alert and oriented to person, place, and time.     Motor: No weakness.     Gait: Gait normal.  Psychiatric:        Mood and Affect: Mood normal.        Behavior: Behavior normal.        Thought Content: Thought content normal.        Judgment: Judgment normal.      UC Treatments / Results  Labs (all labs ordered are listed, but only abnormal results are displayed) Labs Reviewed  SARS CORONAVIRUS 2 (TAT 6-24 HRS)  POCT RAPID STREP A (OFFICE)    EKG   Radiology No results found.  Procedures Procedures (including critical  care time)  Medications Ordered in UC Medications - No data to display  Initial Impression / Assessment and Plan / UC Course  I have reviewed the triage vital signs and the nursing notes.  Pertinent labs & imaging results that were available during my care of the patient were reviewed by me and considered in my medical decision making (see chart for details).     Vital signs and exam very reassuring today, rapid strep negative, COVID testing pending.  Suspect viral respiratory infection.  Treat with viscous lidocaine, supportive over-the-counter medications and home care.  Return for worsening symptoms.  Final Clinical Impressions(s) / UC Diagnoses   Final diagnoses:  Viral URI   Discharge Instructions   None    ED Prescriptions     Medication Sig Dispense Auth. Provider   lidocaine (XYLOCAINE) 2 % solution Use as directed 10 mLs in the mouth or throat every 3 (three) hours as needed for mouth pain. 100 mL Volney American, [place]      PDMP not reviewed this encounter.   Volney American, [place] 01/13/23 1229

## 2023-01-14 LAB — SARS CORONAVIRUS 2 (TAT 6-24 HRS): SARS Coronavirus 2: NEGATIVE

## 2023-01-17 ENCOUNTER — Other Ambulatory Visit: Payer: Self-pay

## 2023-01-17 ENCOUNTER — Encounter (HOSPITAL_COMMUNITY): Payer: Self-pay

## 2023-01-17 ENCOUNTER — Emergency Department (HOSPITAL_COMMUNITY)
Admission: EM | Admit: 2023-01-17 | Discharge: 2023-01-17 | Disposition: A | Discharge status: Home / Self Care | Payer: BC Managed Care – PPO | Attending: Emergency Medicine | Admitting: Emergency Medicine

## 2023-01-17 DIAGNOSIS — R6884 Jaw pain: Secondary | ICD-10-CM | POA: Diagnosis not present

## 2023-01-17 DIAGNOSIS — R09A2 Foreign body sensation, throat: Secondary | ICD-10-CM | POA: Insufficient documentation

## 2023-01-17 DIAGNOSIS — R0989 Other specified symptoms and signs involving the circulatory and respiratory systems: Secondary | ICD-10-CM | POA: Diagnosis not present

## 2023-01-17 MED ORDER — LIDOCAINE VISCOUS HCL 2 % MT SOLN
15.0000 mL | Freq: Once | OROMUCOSAL | Status: AC
  Administered 2023-01-17: 15 mL via OROMUCOSAL
  Filled 2023-01-17: qty 15

## 2023-01-17 MED ORDER — IBUPROFEN 800 MG PO TABS
800.0000 mg | ORAL_TABLET | Freq: Once | ORAL | Status: AC
  Administered 2023-01-17: 800 mg via ORAL
  Filled 2023-01-17: qty 1

## 2023-01-17 NOTE — ED Triage Notes (Signed)
Pt reports left lower jaw pain and feeling like something is in his throat x 2 weeks, pt denies his throat hurts, states it just feels like something is in it.

## 2023-01-17 NOTE — ED Provider Notes (Signed)
[facility] Provider Note   CSN: [id number] Arrival date & time: 01/17/23  2026     History  Chief Complaint  Patient presents with   Jaw Pain   HPI Antonio [name] is a 22 y.o. male patient presenting for lower jaw pain and foreign body sensation in his throat.  Symptoms started a couple weeks ago.  Denies sore throat.  Denies drooling, trouble swallowing or trouble breathing.  States he played basketball 1 night.  And the next morning he had lower jaw pain and foreign body sensation in his throat.  Cannot remember if he ate something that may have caused it.  The persistence of his symptoms prompted his evaluation today.  He was seen earlier today by urgent care and was prescribed viscous lidocaine.  States he just picked up his prescription but wanted to be checked again.  HPI     Home Medications Prior to Admission medications   Medication Sig Start Date End Date Taking? Authorizing Provider  albuterol (PROVENTIL HFA;VENTOLIN HFA) 108 (90 Base) MCG/ACT inhaler Inhale 2 puffs into the lungs every 4 (four) hours as needed for wheezing or shortness of breath (or coughing). Q000111Q   Delora Fuel, MD  amoxicillin (AMOXIL) 875 MG tablet Take 1 tablet (875 mg total) by mouth 2 (two) times daily. 09/03/21   Jaynee Eagles, PA-C  benzonatate (TESSALON) 100 MG capsule Take 1 capsule (100 mg total) by mouth every 8 (eight) hours. 08/18/20   Avegno, Darrelyn Hillock, FNP  cetirizine (ZYRTEC ALLERGY) 10 MG tablet Take 1 tablet (10 mg total) by mouth daily. 09/03/21   Jaynee Eagles, PA-C  fluticasone (FLONASE) 50 MCG/ACT nasal spray Place 1 spray into both nostrils daily for 14 days. 08/18/20 09/01/20  Avegno, Darrelyn Hillock, FNP  fluticasone (FLONASE) 50 MCG/ACT nasal spray Place 1 spray into both nostrils 2 (two) times daily. 03/12/22   Volney American, PA-C  lidocaine (XYLOCAINE) 2 % solution Use as directed 10 mLs in the mouth or throat every 3 (three) hours as  needed for mouth pain. 01/13/23   Volney American, PA-C  loperamide (IMODIUM) 2 MG capsule Take 1 capsule (2 mg total) by mouth 2 (two) times daily as needed for diarrhea or loose stools. 09/17/21   Jaynee Eagles, PA-C  ondansetron (ZOFRAN-ODT) 4 MG disintegrating tablet Take 1 tablet (4 mg total) by mouth every 8 (eight) hours as needed for nausea or vomiting. 04/11/22   Leath-Warren, Alda Lea, NP  predniSONE (DELTASONE) 50 MG tablet Take 1 tablet with breakfast daily x  days 03/12/22   Volney American, PA-C  pseudoephedrine (SUDAFED 12 HOUR) 120 MG 12 hr tablet Take 1 tablet (120 mg total) by mouth every 12 (twelve) hours as needed for congestion. 03/12/22   Volney American, PA-C  pseudoephedrine (SUDAFED) 30 MG tablet Take 1 tablet (30 mg total) by mouth every 8 (eight) hours as needed for congestion. 09/03/21   Jaynee Eagles, PA-C      Allergies    Patient has no known allergies.    Review of Systems   See HPI for pertinent positives   Physical Exam   Vitals:   01/17/23 2033  BP: (!) 140/84  Pulse: 77  Resp: 16  Temp: 98.2 F (36.8 C)  SpO2: 100%    CONSTITUTIONAL:  well-appearing, NAD NEURO:  Alert and oriented x 3, CN 3-12 grossly intact EYES:  eyes equal and reactive ENT/NECK:  Supple, no stridor, no erythema, swelling  or exudate in posterior pharynx.  No observable peritonsillar abscess.  Normal range of motion of the jaw.  No trismus.  Sublingual space appears normal without edema.  No cervical lymphadenopathy. CARDIO:  regular rate and rhythm, appears well-perfused PULM:  No respiratory distress, CTAB GI/GU:  non-distended MSK/SPINE:  No gross deformities, no edema, moves all extremities  SKIN:  no rash, atraumatic   *Additional and/or pertinent findings included in MDM below   ED Results / Procedures / Treatments   Labs (all labs ordered are listed, but only abnormal results are displayed) Labs Reviewed - No data to display  EKG None  Radiology No  results found.  Procedures Procedures    Medications Ordered in ED Medications  lidocaine (XYLOCAINE) 2 % viscous mouth solution 15 mL (has no administration in time range)  ibuprofen (ADVIL) tablet 800 mg (has no administration in time range)    ED Course/ Medical Decision Making/ A&P                             Medical Decision Making  22 year old male who is well-appearing and hemodynamically stable presenting for foreign body sensation in his throat and jaw pain.  DDx includes ingested foreign body, strep pharyngitis, peritonsillar retropharyngeal abscess, Ludwig's angina.  Exam unremarkable.  Foreign body sensation could be possible esophageal irritation and inflammation.  Treated with viscous lidocaine and ibuprofen.  Discussed pertinent return precautions.  Advised him to follow-up with his PCP and 2 to 3 days.  In the meantime he can treat his symptoms at home with ibuprofen and viscous lidocaine.  Advised him to use the viscous lidocaine that was prescribed to him by his urgent care provider earlier today.   In terms of the jaw pain, patient states that at times he does grind his teeth at night is possible that he does have some soreness or inflammation due to that.  Otherwise range of motion his jaw is relatively normal and no evidence of Ludwig's angina on exam.  Again for advised him to follow-up with his PCP.        Final Clinical Impression(s) / ED Diagnoses Final diagnoses:  Foreign body sensation in throat  Jaw pain    Rx / DC Orders ED Discharge Orders     None         Harriet Pho, PA-C 01/17/23 2241    Hayden Rasmussen, MD 01/18/23 1051

## 2023-01-17 NOTE — Discharge Instructions (Signed)
Evaluation for your foreign body sensation in her throat and jaw pain was overall reassuring.  Advise he continue treating with viscous lidocaine at home and he can take ibuprofen as well.  Recommend that you follow-up with your PCP if your symptoms persist in the next 2 to 3 days.  If you have new trouble swallowing, drooling, trouble breathing, fever with sore throat or swollen tongue or any other concerning symptom please return the emergency department for further evaluation.

## 2023-01-21 DIAGNOSIS — R070 Pain in throat: Secondary | ICD-10-CM | POA: Diagnosis not present

## 2023-01-30 ENCOUNTER — Encounter: Payer: Self-pay | Admitting: Family Medicine

## 2023-01-30 ENCOUNTER — Ambulatory Visit (INDEPENDENT_AMBULATORY_CARE_PROVIDER_SITE_OTHER): Payer: BC Managed Care – PPO | Admitting: Family Medicine

## 2023-01-30 VITALS — BP 155/100 | HR 88 | Ht 64.0 in | Wt 163.0 lb

## 2023-01-30 DIAGNOSIS — E039 Hypothyroidism, unspecified: Secondary | ICD-10-CM

## 2023-01-30 DIAGNOSIS — R1310 Dysphagia, unspecified: Secondary | ICD-10-CM

## 2023-01-30 DIAGNOSIS — K5641 Fecal impaction: Secondary | ICD-10-CM

## 2023-01-30 DIAGNOSIS — Z114 Encounter for screening for human immunodeficiency virus [HIV]: Secondary | ICD-10-CM

## 2023-01-30 DIAGNOSIS — Z1159 Encounter for screening for other viral diseases: Secondary | ICD-10-CM | POA: Diagnosis not present

## 2023-01-30 DIAGNOSIS — R7301 Impaired fasting glucose: Secondary | ICD-10-CM | POA: Diagnosis not present

## 2023-01-30 DIAGNOSIS — E785 Hyperlipidemia, unspecified: Secondary | ICD-10-CM

## 2023-01-30 MED ORDER — LIDOCAINE VISCOUS HCL 2 % MT SOLN: 15.0000 mL | OROMUCOSAL | 0 refills | Status: DC | PRN

## 2023-01-30 MED ORDER — PANTOPRAZOLE SODIUM 20 MG PO TBEC: 20.0000 mg | DELAYED_RELEASE_TABLET | Freq: Once | ORAL | 2 refills | Status: DC | PRN

## 2023-01-30 NOTE — Assessment & Plan Note (Addendum)
Prescribed Protonix 20 mg for possible gastritis, lidocaine solution for throat pain, Advise OTC bisacodyl suppository for constipation.   CT abdomen pelvis ordered, referral to GI for further evaluation.  Discussed to increase oral fluid intake. Bland diet as tolerated. Avoid fluids that have a lot sugar or caffeine, Avoid spicy or fatty food. Avoid alcohol. Can take OTC tylenol for pain. Follow-up in unable to keep food/fluid down x 24 hours, dizziness, fevers, worsening or persistent symptoms to present to ED or contact primary care provider. Patient verbalizes understanding regarding plan of care and all questions answered.

## 2023-01-30 NOTE — Patient Instructions (Signed)
It was pleasure meeting with you today. Follow up with your primary health provider if any health concerns arises.  

## 2023-01-30 NOTE — Progress Notes (Signed)
New Patient Office Visit   Subjective   Patient ID: Antonio [name], male    DOB: 24-Dec-2000  Age: 22 y.o. MRN: [id number]  CC:  Chief Complaint  Patient presents with   Establish Care   Choking    Patient states he has been having trouble keeping food down. Is not vomiting, but is having trouble swallowing. Food is going up into his nose.     HPI Antonio [name] 22 year old male, presents to establish care. He  has a past medical history of Environmental allergies (03/25/2013) and Lactose intolerance.  Painful swallowing with epigastric discomfort   Patient reports everytime he eats it hard for him to swallow and at times the food comes up his nose with epigastric discomfort. Patient reported losing 10 lbs in the past 3 weeks due to this condition. Last bowel movement was 5 days ago. Patient stated these type of episodes occurs intermittently and has been gradually worsening since onset. The pain is at a severity of 3/10. Associated symptoms include constipation, a sore throat and vomiting. Pertinent negatives include no diarrhea, dysuria, fever, headaches, myalgias. Nothing relieves the symptoms. Patient has not tried any treatment for this condition.  There is no past medical history of abdominal surgery, GERD, IBS.      Outpatient Encounter Medications as of 01/30/2023  Medication Sig   albuterol (PROVENTIL HFA;VENTOLIN HFA) 108 (90 Base) MCG/ACT inhaler Inhale 2 puffs into the lungs every 4 (four) hours as needed for wheezing or shortness of breath (or coughing).   cetirizine (ZYRTEC ALLERGY) 10 MG tablet Take 1 tablet (10 mg total) by mouth daily.   fluticasone (FLONASE) 50 MCG/ACT nasal spray Place 1 spray into both nostrils 2 (two) times daily.   lidocaine (XYLOCAINE) 2 % solution Use as directed 15 mLs in the mouth or throat as needed for mouth pain.   pantoprazole (PROTONIX) 20 MG tablet Take 1 tablet (20 mg total) by mouth once as needed for up to 1 dose.   [DISCONTINUED]  lidocaine (XYLOCAINE) 2 % solution Use as directed 10 mLs in the mouth or throat every 3 (three) hours as needed for mouth pain.   amoxicillin (AMOXIL) 875 MG tablet Take 1 tablet (875 mg total) by mouth 2 (two) times daily.   benzonatate (TESSALON) 100 MG capsule Take 1 capsule (100 mg total) by mouth every 8 (eight) hours.   fluticasone (FLONASE) 50 MCG/ACT nasal spray Place 1 spray into both nostrils daily for 14 days.   loperamide (IMODIUM) 2 MG capsule Take 1 capsule (2 mg total) by mouth 2 (two) times daily as needed for diarrhea or loose stools.   ondansetron (ZOFRAN-ODT) 4 MG disintegrating tablet Take 1 tablet (4 mg total) by mouth every 8 (eight) hours as needed for nausea or vomiting.   predniSONE (DELTASONE) 50 MG tablet Take 1 tablet with breakfast daily x  days   pseudoephedrine (SUDAFED 12 HOUR) 120 MG 12 hr tablet Take 1 tablet (120 mg total) by mouth every 12 (twelve) hours as needed for congestion.   pseudoephedrine (SUDAFED) 30 MG tablet Take 1 tablet (30 mg total) by mouth every 8 (eight) hours as needed for congestion.   No facility-administered encounter medications on file as of 01/30/2023.    Past Surgical History:  Procedure Laterality Date   TONSILLECTOMY      Review of Systems  Constitutional:  Negative for chills, fever and malaise/fatigue.  Respiratory:  Negative for shortness of breath.   Cardiovascular:  Negative  for chest pain.  Gastrointestinal:  Positive for abdominal pain, constipation, heartburn and vomiting. Negative for blood in stool, melena and nausea.  Genitourinary:  Negative for dysuria.  Neurological:  Negative for dizziness and headaches.      Objective    BP (!) 155/100   Pulse 88   Ht 5\' 4"  (1.626 m)   Wt 163 lb (73.9 kg)   SpO2 94%   BMI 27.98 kg/m   Physical Exam Constitutional:      Appearance: Normal appearance.  Cardiovascular:     Rate and Rhythm: Regular rhythm.     Pulses: Normal pulses.     Heart sounds: Normal heart  sounds.  Pulmonary:     Effort: Pulmonary effort is normal.     Breath sounds: Normal breath sounds.  Abdominal:     General: Bowel sounds are normal.     Palpations: Abdomen is soft. There is no mass.     Tenderness: There is no abdominal tenderness. There is no right CVA tenderness, left CVA tenderness or guarding.  Musculoskeletal:        General: Normal range of motion.  Skin:    General: Skin is warm and dry.     Capillary Refill: Capillary refill takes less than 2 seconds.  Neurological:     General: No focal deficit present.     Mental Status: He is alert.  Psychiatric:        Mood and Affect: Mood normal.       Assessment & Plan:  Painful swallowing Assessment & Plan: Prescribed Protonix 20 mg for possible gastritis, lidocaine solution for throat pain, Advise OTC bisacodyl suppository for constipation.   CT abdomen pelvis ordered, referral to GI for further evaluation.  Discussed to increase oral fluid intake. Bland diet as tolerated. Avoid fluids that have a lot sugar or caffeine, Avoid spicy or fatty food. Avoid alcohol. Can take OTC tylenol for pain. Follow-up in unable to keep food/fluid down x 24 hours, dizziness, fevers, worsening or persistent symptoms to present to ED or contact primary care provider. Patient verbalizes understanding regarding plan of care and all questions answered.   Orders: -     Ambulatory referral to Gastroenterology -     CT ABDOMEN PELVIS WO CONTRAST; Future  Hyperlipidemia, unspecified hyperlipidemia type -     CBC with Differential/Platelet -     CMP14+EGFR -     Lipid panel  IFG (impaired fasting glucose) -     Hemoglobin A1c  Hypothyroidism, unspecified type -     TSH + free T4  Need for hepatitis C screening test -     Hepatitis C antibody  Screening for HIV (human immunodeficiency virus) -     HIV Antibody (routine testing w rflx)  Fecal impaction ([facility])  Other orders -     Pantoprazole Sodium; Take 1 tablet (20 mg  total) by mouth once as needed for up to 1 dose.  Dispense: 30 tablet; Refill: 2 -     Lidocaine Viscous HCl; Use as directed 15 mLs in the mouth or throat as needed for mouth pain.  Dispense: 100 mL; Refill: 0    No follow-ups on file.   Renard Hamper Ria Comment, FNP

## 2023-01-31 ENCOUNTER — Encounter: Payer: Self-pay | Admitting: Family Medicine

## 2023-01-31 LAB — CBC WITH DIFFERENTIAL/PLATELET
Basophils Absolute: 0 10*3/uL (ref 0.0–0.2)
Basos: 1 %
EOS (ABSOLUTE): 0.1 10*3/uL (ref 0.0–0.4)
Eos: 3 %
Hematocrit: 47.3 % (ref 37.5–51.0)
Hemoglobin: 15.7 g/dL (ref 13.0–17.7)
Immature Grans (Abs): 0 10*3/uL (ref 0.0–0.1)
Immature Granulocytes: 0 %
Lymphocytes Absolute: 1.7 10*3/uL (ref 0.7–3.1)
Lymphs: 41 %
MCH: 27.7 pg (ref 26.6–33.0)
MCHC: 33.2 g/dL (ref 31.5–35.7)
MCV: 83 fL (ref 79–97)
Monocytes Absolute: 0.5 10*3/uL (ref 0.1–0.9)
Monocytes: 12 %
Neutrophils Absolute: 1.8 10*3/uL (ref 1.4–7.0)
Neutrophils: 43 %
Platelets: 240 10*3/uL (ref 150–450)
RBC: 5.67 x10E6/uL (ref 4.14–5.80)
RDW: 12.8 % (ref 11.6–15.4)
WBC: 4.1 10*3/uL (ref 3.4–10.8)

## 2023-01-31 LAB — TSH+FREE T4
Free T4: 1.53 ng/dL (ref 0.82–1.77)
TSH: 0.991 u[IU]/mL (ref 0.450–4.500)

## 2023-01-31 LAB — HEMOGLOBIN A1C
Est. average glucose Bld gHb Est-mCnc: 108 mg/dL
Hgb A1c MFr Bld: 5.4 % (ref 4.8–5.6)

## 2023-01-31 LAB — LIPID PANEL
Chol/HDL Ratio: 4.8 ratio (ref 0.0–5.0)
Cholesterol, Total: 148 mg/dL (ref 100–199)
HDL: 31 mg/dL — ABNORMAL LOW (ref >39)
LDL Chol Calc (NIH): 103 mg/dL — ABNORMAL HIGH (ref 0–99)
Triglycerides: 71 mg/dL (ref 0–149)
VLDL Cholesterol Cal: 14 mg/dL (ref 5–40)

## 2023-01-31 LAB — CMP14+EGFR
ALT: 22 IU/L (ref 0–44)
AST: 18 IU/L (ref 0–40)
Albumin/Globulin Ratio: 1.9 (ref 1.2–2.2)
Albumin: 5 g/dL (ref 4.3–5.2)
Alkaline Phosphatase: 130 IU/L — ABNORMAL HIGH (ref 51–125)
BUN/Creatinine Ratio: 13 (ref 9–20)
BUN: 14 mg/dL (ref 6–20)
Bilirubin Total: 0.9 mg/dL (ref 0.0–1.2)
CO2: 21 mmol/L (ref 20–29)
Calcium: 10 mg/dL (ref 8.7–10.2)
Chloride: 100 mmol/L (ref 96–106)
Creatinine, Ser: 1.12 mg/dL (ref 0.76–1.27)
Globulin, Total: 2.7 g/dL (ref 1.5–4.5)
Glucose: 72 mg/dL (ref 70–99)
Potassium: 4.6 mmol/L (ref 3.5–5.2)
Sodium: 141 mmol/L (ref 134–144)
Total Protein: 7.7 g/dL (ref 6.0–8.5)
eGFR: 96 mL/min/{1.73_m2} (ref >59)

## 2023-01-31 LAB — HEPATITIS C ANTIBODY: Hep C Virus Ab: NONREACTIVE

## 2023-01-31 LAB — HIV ANTIBODY (ROUTINE TESTING W REFLEX): HIV Screen 4th Generation wRfx: NONREACTIVE

## 2023-02-03 NOTE — H&P (View-Only) (Signed)
GI Office Note    Referring Provider: Damaris Hippo* Primary Care Physician:  Juanda Chance, [place]  Primary Gastroenterologist: Garfield Cornea, MD   Chief Complaint   Chief Complaint  Patient presents with   Dysphagia    Throat pain, hurts to swallow     History of Present Illness   Antonio [name] is a 22 y.o. male presenting today at the request of Juanda Chance FNP for painful swallowing and 10 pound weight loss.  Patient reports throat issues since early March. Little better now but several weeks ago he noted the sensation that it was difficult to swallow and of something stuck in the throat. At times food would come out of nose. Although symptoms are improved, not normal. No vomiting. No heartburn. Food no longer regurgitating. Initially he tried Pepto. He tried the lidocaine solution provided by PCP but did not like the way it made him feel. He never tried the pantoprazole 20mg  daily. He uses ibuprofen on occasion but no NSAIDS/ASA regularly. BMs 3-4 times per week. States his PCP was worried he was constipated and recommended suppository but he did not feel he needed this. He has soft stools, no straining. No melena, brbpr. When he was seen by PCP recently he was having epigastric pain. CT A/P ordered but pending.   He notes he has stopped vaping.   Wt Readings from Last 3 Encounters:  02/04/23 162 lb 12.8 oz (73.8 kg)  01/30/23 163 lb (73.9 kg)  01/17/23 170 lb (77.1 kg)      Medications   Current Outpatient Medications  Medication Sig Dispense Refill   cetirizine (ZYRTEC ALLERGY) 10 MG tablet Take 1 tablet (10 mg total) by mouth daily. 30 tablet 0   No current facility-administered medications for this visit.    Allergies   Allergies as of 02/04/2023   (No Known Allergies)    Past Medical History   Past Medical History:  Diagnosis Date   Environmental allergies 03/25/2013   Lactose intolerance     Past Surgical  History   Past Surgical History:  Procedure Laterality Date   TONSILLECTOMY      Past Family History   Family History  Problem Relation Age of Onset   Healthy Mother    Healthy Father    Healthy Sister    Hypertension Paternal Grandmother    Cancer Paternal Grandfather        prostate    Past Social History   Social History   Socioeconomic History   Marital status: Single    Spouse name: Not on file   Number of children: Not on file   Years of education: Not on file   Highest education level: Not on file  Occupational History   Not on file  Tobacco Use   Smoking status: Never    Passive exposure: Yes   Smokeless tobacco: Never  Vaping Use   Vaping Use: Former  Substance and Sexual Activity   Alcohol use: No   Drug use: No   Sexual activity: Not on file  Other Topics Concern   Not on file  Social History Narrative   Lives with parents and siblings      Graduated from high school 2022, currently in [place] Determinants of Health   Financial Resource Strain: Not on file  Food Insecurity: Not on file  Transportation Needs: Not on file  Physical Activity: Not on file  Stress:  Not on file  Social Connections: Not on file  Intimate Partner Violence: Not on file    Review of Systems   General: Negative for anorexia, weight loss, fever, chills, fatigue, weakness. Eyes: Negative for vision changes.  ENT: Negative for hoarseness,  nasal congestion. See hpi CV: Negative for chest pain, angina, palpitations, dyspnea on exertion, peripheral edema.  Respiratory: Negative for dyspnea at rest, dyspnea on exertion, cough, sputum, wheezing.  GI: See history of present illness. GU:  Negative for dysuria, hematuria, urinary incontinence, urinary frequency, nocturnal urination.  MS: Negative for joint pain, low back pain.  Derm: Negative for rash or itching.  Neuro: Negative for weakness, abnormal sensation, seizure, frequent headaches, memory loss,   confusion.  Psych: Negative for anxiety, depression, suicidal ideation, hallucinations.  Endo: Negative for unusual weight change.  Heme: Negative for bruising or bleeding. Allergy: Negative for rash or hives.  Physical Exam   BP 131/84 (BP Location: Left Arm, Patient Position: Sitting, Cuff Size: Normal)   Pulse 65   Temp 98.2 F (36.8 C) (Temporal)   Ht 5\' 4"  (1.626 m)   Wt 162 lb 12.8 oz (73.8 kg)   SpO2 97%   BMI 27.94 kg/m    General: Well-nourished, well-developed in no acute distress.  Head: Normocephalic, atraumatic.   Eyes: Conjunctiva pink, no icterus. Mouth: Oropharyngeal mucosa moist and pink  Neck: Supple without thyromegaly, masses, or lymphadenopathy.  Lungs: Clear to auscultation bilaterally.  Heart: Regular rate and rhythm, no murmurs rubs or gallops.  Abdomen: Bowel sounds are normal, nontender, nondistended, no hepatosplenomegaly or masses,  no abdominal bruits or hernia, no rebound or guarding.   Rectal: not performed Extremities: No lower extremity edema. No clubbing or deformities.  Neuro: Alert and oriented x 4 , grossly normal neurologically.  Skin: Warm and dry, no rash or jaundice.   Psych: Alert and cooperative, normal mood and affect.  Labs   Lab Results  Component Value Date   TSH 0.991 01/30/2023   Lab Results  Component Value Date   CREATININE 1.12 01/30/2023   BUN 14 01/30/2023   NA 141 01/30/2023   K 4.6 01/30/2023   CL 100 01/30/2023   CO2 21 01/30/2023   Lab Results  Component Value Date   ALT 22 01/30/2023   AST 18 01/30/2023   ALKPHOS 130 (H) 01/30/2023   BILITOT 0.9 01/30/2023   Lab Results  Component Value Date   HGBA1C 5.4 01/30/2023   Lab Results  Component Value Date   WBC 4.1 01/30/2023   HGB 15.7 01/30/2023   HCT 47.3 01/30/2023   MCV 83 01/30/2023   PLT 240 01/30/2023    Imaging Studies   No results found.  Assessment   Odynophagia/Dysphagia: recent urgent care and ED visits with URI symptoms and  odynophagia. Possible viral esophagitis, reflux esophagitis, EOE. Given ongoing symptoms for several weeks, recommend EGD for further evaluation.   Patient has pending CT A/P. He is no longer having abdominal pain. We discussed he could consider not having CT due to resolution of symptoms, but he should discuss with ordering provider.   Mild elevated of alk phos: consider repeat fasting   PLAN   Start pantoprazole 40mg  daily before breakfast.  EGD/ED with Dr. Gala Romney. ASA 2.  I have discussed the risks, alternatives, benefits with regards to but not limited to the risk of reaction to medication, bleeding, infection, perforation and the patient is agreeable to proceed. Written consent to be obtained.   Laureen Ochs. Bobby Rumpf,  MHS, PA-C [facility]

## 2023-02-03 NOTE — Progress Notes (Signed)
GI Office Note    Referring Provider: Damaris Hippo* Primary Care Physician:  Juanda Chance, [place]  Primary Gastroenterologist: Garfield Cornea, MD   Chief Complaint   Chief Complaint  Patient presents with   Dysphagia    Throat pain, hurts to swallow     History of Present Illness   Antonio [name] is a 22 y.o. male presenting today at the request of Juanda Chance FNP for painful swallowing and 10 pound weight loss.  Patient reports throat issues since early March. Little better now but several weeks ago he noted the sensation that it was difficult to swallow and of something stuck in the throat. At times food would come out of nose. Although symptoms are improved, not normal. No vomiting. No heartburn. Food no longer regurgitating. Initially he tried Pepto. He tried the lidocaine solution provided by PCP but did not like the way it made him feel. He never tried the pantoprazole 20mg  daily. He uses ibuprofen on occasion but no NSAIDS/ASA regularly. BMs 3-4 times per week. States his PCP was worried he was constipated and recommended suppository but he did not feel he needed this. He has soft stools, no straining. No melena, brbpr. When he was seen by PCP recently he was having epigastric pain. CT A/P ordered but pending.   He notes he has stopped vaping.   Wt Readings from Last 3 Encounters:  02/04/23 162 lb 12.8 oz (73.8 kg)  01/30/23 163 lb (73.9 kg)  01/17/23 170 lb (77.1 kg)      Medications   Current Outpatient Medications  Medication Sig Dispense Refill   cetirizine (ZYRTEC ALLERGY) 10 MG tablet Take 1 tablet (10 mg total) by mouth daily. 30 tablet 0   No current facility-administered medications for this visit.    Allergies   Allergies as of 02/04/2023   (No Known Allergies)    Past Medical History   Past Medical History:  Diagnosis Date   Environmental allergies 03/25/2013   Lactose intolerance     Past Surgical  History   Past Surgical History:  Procedure Laterality Date   TONSILLECTOMY      Past Family History   Family History  Problem Relation Age of Onset   Healthy Mother    Healthy Father    Healthy Sister    Hypertension Paternal Grandmother    Cancer Paternal Grandfather        prostate    Past Social History   Social History   Socioeconomic History   Marital status: Single    Spouse name: Not on file   Number of children: Not on file   Years of education: Not on file   Highest education level: Not on file  Occupational History   Not on file  Tobacco Use   Smoking status: Never    Passive exposure: Yes   Smokeless tobacco: Never  Vaping Use   Vaping Use: Former  Substance and Sexual Activity   Alcohol use: No   Drug use: No   Sexual activity: Not on file  Other Topics Concern   Not on file  Social History Narrative   Lives with parents and siblings      Graduated from high school 2022, currently in [place] Determinants of Health   Financial Resource Strain: Not on file  Food Insecurity: Not on file  Transportation Needs: Not on file  Physical Activity: Not on file  Stress:  Not on file  Social Connections: Not on file  Intimate Partner Violence: Not on file    Review of Systems   General: Negative for anorexia, weight loss, fever, chills, fatigue, weakness. Eyes: Negative for vision changes.  ENT: Negative for hoarseness,  nasal congestion. See hpi CV: Negative for chest pain, angina, palpitations, dyspnea on exertion, peripheral edema.  Respiratory: Negative for dyspnea at rest, dyspnea on exertion, cough, sputum, wheezing.  GI: See history of present illness. GU:  Negative for dysuria, hematuria, urinary incontinence, urinary frequency, nocturnal urination.  MS: Negative for joint pain, low back pain.  Derm: Negative for rash or itching.  Neuro: Negative for weakness, abnormal sensation, seizure, frequent headaches, memory loss,   confusion.  Psych: Negative for anxiety, depression, suicidal ideation, hallucinations.  Endo: Negative for unusual weight change.  Heme: Negative for bruising or bleeding. Allergy: Negative for rash or hives.  Physical Exam   BP 131/84 (BP Location: Left Arm, Patient Position: Sitting, Cuff Size: Normal)   Pulse 65   Temp 98.2 F (36.8 C) (Temporal)   Ht 5\' 4"  (1.626 m)   Wt 162 lb 12.8 oz (73.8 kg)   SpO2 97%   BMI 27.94 kg/m    General: Well-nourished, well-developed in no acute distress.  Head: Normocephalic, atraumatic.   Eyes: Conjunctiva pink, no icterus. Mouth: Oropharyngeal mucosa moist and pink  Neck: Supple without thyromegaly, masses, or lymphadenopathy.  Lungs: Clear to auscultation bilaterally.  Heart: Regular rate and rhythm, no murmurs rubs or gallops.  Abdomen: Bowel sounds are normal, nontender, nondistended, no hepatosplenomegaly or masses,  no abdominal bruits or hernia, no rebound or guarding.   Rectal: not performed Extremities: No lower extremity edema. No clubbing or deformities.  Neuro: Alert and oriented x 4 , grossly normal neurologically.  Skin: Warm and dry, no rash or jaundice.   Psych: Alert and cooperative, normal mood and affect.  Labs   Lab Results  Component Value Date   TSH 0.991 01/30/2023   Lab Results  Component Value Date   CREATININE 1.12 01/30/2023   BUN 14 01/30/2023   NA 141 01/30/2023   K 4.6 01/30/2023   CL 100 01/30/2023   CO2 21 01/30/2023   Lab Results  Component Value Date   ALT 22 01/30/2023   AST 18 01/30/2023   ALKPHOS 130 (H) 01/30/2023   BILITOT 0.9 01/30/2023   Lab Results  Component Value Date   HGBA1C 5.4 01/30/2023   Lab Results  Component Value Date   WBC 4.1 01/30/2023   HGB 15.7 01/30/2023   HCT 47.3 01/30/2023   MCV 83 01/30/2023   PLT 240 01/30/2023    Imaging Studies   No results found.  Assessment   Odynophagia/Dysphagia: recent urgent care and ED visits with URI symptoms and  odynophagia. Possible viral esophagitis, reflux esophagitis, EOE. Given ongoing symptoms for several weeks, recommend EGD for further evaluation.   Patient has pending CT A/P. He is no longer having abdominal pain. We discussed he could consider not having CT due to resolution of symptoms, but he should discuss with ordering provider.   Mild elevated of alk phos: consider repeat fasting   PLAN   Start pantoprazole 40mg  daily before breakfast.  EGD/ED with Dr. Gala Romney. ASA 2.  I have discussed the risks, alternatives, benefits with regards to but not limited to the risk of reaction to medication, bleeding, infection, perforation and the patient is agreeable to proceed. Written consent to be obtained.   Laureen Ochs. Bobby Rumpf,  MHS, PA-C [facility]

## 2023-02-04 ENCOUNTER — Encounter: Payer: Self-pay | Admitting: *Deleted

## 2023-02-04 ENCOUNTER — Encounter: Payer: Self-pay | Admitting: Gastroenterology

## 2023-02-04 ENCOUNTER — Ambulatory Visit (INDEPENDENT_AMBULATORY_CARE_PROVIDER_SITE_OTHER): Payer: BC Managed Care – PPO | Admitting: Gastroenterology

## 2023-02-04 VITALS — BP 131/84 | HR 65 | Temp 98.2°F | Ht 64.0 in | Wt 162.8 lb

## 2023-02-04 DIAGNOSIS — R1310 Dysphagia, unspecified: Secondary | ICD-10-CM | POA: Diagnosis not present

## 2023-02-04 MED ORDER — PANTOPRAZOLE SODIUM 40 MG PO TBEC: 40.0000 mg | DELAYED_RELEASE_TABLET | Freq: Every day | ORAL | 3 refills | Status: DC

## 2023-02-04 NOTE — Patient Instructions (Signed)
Start pantoprazole 40mg  daily before breakfast (MAKE SURE YOU GET THE 40MG  DOSAGE FILLED NOT THE FIRST PRESCRIPTION FOR 20MG ).  Upper endoscopy to be scheduled.

## 2023-02-08 ENCOUNTER — Telehealth: Payer: Self-pay | Admitting: Gastroenterology

## 2023-02-08 NOTE — Telephone Encounter (Signed)
Antonio [name], patient seen in office last week. He needs repeat LFTs, fasting, in 4 weeks due to elevated alk phos. Please arrange.

## 2023-02-10 ENCOUNTER — Other Ambulatory Visit: Payer: Self-pay

## 2023-02-10 DIAGNOSIS — R748 Abnormal levels of other serum enzymes: Secondary | ICD-10-CM

## 2023-02-10 NOTE — Telephone Encounter (Signed)
Lab was ordered and will be mailed to the pt to complete in 4 weeks.

## 2023-02-11 ENCOUNTER — Ambulatory Visit (HOSPITAL_COMMUNITY)
Admission: RE | Admit: 2023-02-11 | Discharge: 2023-02-11 | Disposition: A | Discharge status: Home / Self Care | Payer: BC Managed Care – PPO | Source: Ambulatory Visit | Attending: Family Medicine | Admitting: Family Medicine

## 2023-02-11 DIAGNOSIS — R1310 Dysphagia, unspecified: Secondary | ICD-10-CM | POA: Diagnosis not present

## 2023-02-11 DIAGNOSIS — R109 Unspecified abdominal pain: Secondary | ICD-10-CM | POA: Diagnosis not present

## 2023-02-13 ENCOUNTER — Other Ambulatory Visit: Payer: Self-pay

## 2023-02-13 ENCOUNTER — Encounter (HOSPITAL_COMMUNITY)
Admission: RE | Disposition: A | Discharge status: Home / Self Care | Payer: Self-pay | Source: Home / Self Care | Attending: Internal Medicine

## 2023-02-13 ENCOUNTER — Encounter (HOSPITAL_COMMUNITY): Payer: Self-pay | Admitting: Internal Medicine

## 2023-02-13 ENCOUNTER — Ambulatory Visit (HOSPITAL_COMMUNITY): Payer: BC Managed Care – PPO | Admitting: Anesthesiology

## 2023-02-13 ENCOUNTER — Ambulatory Visit (HOSPITAL_COMMUNITY)
Admission: RE | Admit: 2023-02-13 | Discharge: 2023-02-13 | Disposition: A | Discharge status: Home / Self Care | Payer: BC Managed Care – PPO | Attending: Internal Medicine | Admitting: Internal Medicine

## 2023-02-13 DIAGNOSIS — K222 Esophageal obstruction: Secondary | ICD-10-CM

## 2023-02-13 DIAGNOSIS — Z87891 Personal history of nicotine dependence: Secondary | ICD-10-CM | POA: Insufficient documentation

## 2023-02-13 DIAGNOSIS — K449 Diaphragmatic hernia without obstruction or gangrene: Secondary | ICD-10-CM | POA: Diagnosis not present

## 2023-02-13 DIAGNOSIS — K2210 Ulcer of esophagus without bleeding: Secondary | ICD-10-CM | POA: Diagnosis not present

## 2023-02-13 DIAGNOSIS — R1310 Dysphagia, unspecified: Secondary | ICD-10-CM | POA: Insufficient documentation

## 2023-02-13 DIAGNOSIS — Z79899 Other long term (current) drug therapy: Secondary | ICD-10-CM | POA: Diagnosis not present

## 2023-02-13 HISTORY — PX: ESOPHAGOGASTRODUODENOSCOPY (EGD) WITH PROPOFOL: SHX5813

## 2023-02-13 HISTORY — PX: MALONEY DILATION: SHX5535

## 2023-02-13 SURGERY — ESOPHAGOGASTRODUODENOSCOPY (EGD) WITH PROPOFOL
Anesthesia: General

## 2023-02-13 MED ORDER — PROPOFOL 10 MG/ML IV BOLUS
INTRAVENOUS | Status: DC | PRN
  Administered 2023-02-13: 50 mg via INTRAVENOUS
  Administered 2023-02-13: 100 mg via INTRAVENOUS
  Administered 2023-02-13: 200 mg via INTRAVENOUS

## 2023-02-13 MED ORDER — LACTATED RINGERS IV SOLN: INTRAVENOUS | Status: DC

## 2023-02-13 MED ORDER — LIDOCAINE HCL (CARDIAC) PF 100 MG/5ML IV SOSY
PREFILLED_SYRINGE | INTRAVENOUS | Status: DC | PRN
  Administered 2023-02-13: 100 mg via INTRAVENOUS

## 2023-02-13 NOTE — Anesthesia Preprocedure Evaluation (Signed)
Anesthesia Evaluation  Patient identified by MRN, date of birth, ID band Patient awake    Reviewed: Allergy & Precautions, H&P , NPO status , Patient's Chart, lab work & pertinent test results, reviewed documented beta blocker date and time   Airway Mallampati: II  TM Distance: >3 FB Neck ROM: full    Dental no notable dental hx.    Pulmonary neg pulmonary ROS   Pulmonary exam normal breath sounds clear to auscultation       Cardiovascular Exercise Tolerance: Good negative cardio ROS  Rhythm:regular Rate:Normal     Neuro/Psych negative neurological ROS  negative psych ROS   GI/Hepatic negative GI ROS, Neg liver ROS,,,  Endo/Other  negative endocrine ROS    Renal/GU negative Renal ROS  negative genitourinary   Musculoskeletal   Abdominal   Peds  Hematology negative hematology ROS (+)   Anesthesia Other Findings   Reproductive/Obstetrics negative OB ROS                             Anesthesia Physical Anesthesia Plan  ASA: 2  Anesthesia Plan: General   Post-op Pain Management:    Induction:   PONV Risk Score and Plan: Propofol infusion  Airway Management Planned:   Additional Equipment:   Intra-op Plan:   Post-operative Plan:   Informed Consent: I have reviewed the patients History and Physical, chart, labs and discussed the procedure including the risks, benefits and alternatives for the proposed anesthesia with the patient or authorized representative who has indicated his/her understanding and acceptance.     Dental Advisory Given  Plan Discussed with: CRNA  Anesthesia Plan Comments:        Anesthesia Quick Evaluation  

## 2023-02-13 NOTE — Transfer of Care (Signed)
Immediate Anesthesia Transfer of Care Note  Patient: Antonio [name]  Procedure(s) Performed: ESOPHAGOGASTRODUODENOSCOPY (EGD) WITH PROPOFOL [place]  Patient Location: Endoscopy Unit  Anesthesia Type:General  Level of Consciousness: sedated  Airway & Oxygen Therapy: Patient Spontanous Breathing  Post-op Assessment: Report given to RN and Post -op Vital signs reviewed and stable  Post vital signs: Reviewed and stable  Last Vitals:  Vitals Value Taken Time  BP 109/60 02/13/23 1336  Temp 97.8 02/13/2023 1336  Pulse 71 02/13/23 1336  Resp 17 02/13/23 1336  SpO2 94 % 02/13/23 1336    Last Pain:  Vitals:   02/13/23 1321  TempSrc:   PainSc: 0-No pain         Complications: No notable events documented.

## 2023-02-13 NOTE — Interval H&P Note (Signed)
History and Physical Interval Note:  02/13/2023 1:19 PM  Antonio [name]  has presented today for surgery, with the diagnosis of dysphagia.  The various methods of treatment have been discussed with the patient and family. After consideration of risks, benefits and other options for treatment, the patient has consented to  Procedure(s) with comments: ESOPHAGOGASTRODUODENOSCOPY (EGD) WITH PROPOFOL (N/A) - 1:30 pm MALONEY DILATION (N/A) as a surgical intervention.  The patient's history has been reviewed, patient examined, no change in status, stable for surgery.  I have reviewed the patient's chart and labs.  Questions were answered to the patient's satisfaction.     Manus Rudd    Patient states odynophagia/dysphagia improved but not resolved.  EGD with possible esophageal dilation as feasible appropriate per plan today.  The risks, benefits, limitations, alternatives and imponderables have been reviewed with the patient. Potential for esophageal dilation, biopsy, etc. have also been reviewed.  Questions have been answered. All parties agreeable.

## 2023-02-13 NOTE — Discharge Instructions (Addendum)
EGD Discharge instructions Please read the instructions outlined below and refer to this sheet in the next few weeks. These discharge instructions provide you with general information on caring for yourself after you leave the hospital. Your doctor may also give you specific instructions. While your treatment has been planned according to the most current medical practices available, unavoidable complications occasionally occur. If you have any problems or questions after discharge, please call your doctor. ACTIVITY You may resume your regular activity but move at a slower pace for the next 24 hours.  Take frequent rest periods for the next 24 hours.  Walking will help expel (get rid of) the air and reduce the bloated feeling in your abdomen.  No driving for 24 hours (because of the anesthesia (medicine) used during the test).  You may shower.  Do not sign any important legal documents or operate any machinery for 24 hours (because of the anesthesia used during the test).  NUTRITION Drink plenty of fluids.  You may resume your normal diet.  Begin with a light meal and progress to your normal diet.  Avoid alcoholic beverages for 24 hours or as instructed by your caregiver.  MEDICATIONS You may resume your normal medications unless your caregiver tells you otherwise.  WHAT YOU CAN EXPECT TODAY You may experience abdominal discomfort such as a feeling of fullness or "gas" pains.  FOLLOW-UP Your doctor will discuss the results of your test with you.  SEEK IMMEDIATE MEDICAL ATTENTION IF ANY OF THE FOLLOWING OCCUR: Excessive nausea (feeling sick to your stomach) and/or vomiting.  Severe abdominal pain and distention (swelling).  Trouble swallowing.  Temperature over 101 F (37.8 C).  Rectal bleeding or vomiting of blood.      You have acid reflux irritation in your esophagus.  You also have a narrowing where your esophagus connects to your stomach.  Small hiatal hernia.  Hiatal hernia and acid  reflux is treated with pantoprazole or Protonix 40 mg daily which you are already taking.  Biopsy of your esophagus was not needed and not done. Your esophagus was stretched.  Continue Protonix 40 mg daily   office visit with Neil Crouch in 3 months    OFFICE WILL CONTACT YOU OR YOU CAN CHECK [place]    at patient request, I called Derenda Mis at [phone] -  reviewed findings and recommendations

## 2023-02-13 NOTE — Op Note (Addendum)
[facility] Patient Name: Antonio [name] Procedure Date: 02/13/2023 12:34 PM MRN: [id number] Date of Birth: 08/19/2001 Attending MD: Norvel Richards , MD, [id number] CSN: [id number] Age: 22 Admit Type: Outpatient Procedure:                Upper GI endoscopy Indications:              Dysphagia, Odynophagia -improved since starting                            pantoprazole 40 mg daily Providers:                Norvel Richards, MD, Rosina Lowenstein, RN, Raphael Gibney, Technician Referring MD:              Medicines:                Propofol per Anesthesia Complications:            No immediate complications. Estimated Blood Loss:     Estimated blood loss: none. Procedure:                Pre-Anesthesia Assessment:                           - Prior to the procedure, a History and Physical                            was performed, and patient medications and                            allergies were reviewed. The patient's tolerance of                            previous anesthesia was also reviewed. The risks                            and benefits of the procedure and the sedation                            options and risks were discussed with the patient.                            All questions were answered, and informed consent                            was obtained. Prior Anticoagulants: The patient has                            taken no anticoagulant or antiplatelet agents. ASA                            Grade Assessment: II - A patient with mild systemic  disease. After reviewing the risks and benefits,                            the patient was deemed in satisfactory condition to                            undergo the procedure.                           After obtaining informed consent, the endoscope was                            passed under direct vision. Throughout the                            procedure, the  patient's blood pressure, pulse, and                            oxygen saturations were monitored continuously. The                            [id number] scope was introduced through the                            mouth, and advanced to the second part of duodenum.                            The upper GI endoscopy was accomplished without                            difficulty. The patient tolerated the procedure                            well. Scope In: 1:27:07 PM Scope Out: 1:33:50 PM Total Procedure Duration: 0 hours 6 minutes 43 seconds  Findings:      A mild Schatzki ring was found at the gastroesophageal junction. Single       5 mm erosion straddling the GE junction. Somewhat patulous EG junction.      A small hiatal hernia was present. Gastric cavity empty. Gastric mucosa       appeared normal. Pylorus patent.The scope was withdrawn. Dilation was       performed with a Maloney dilator with mild resistance at 53 Fr. The       dilation site was examined following endoscope reinsertion and showed no       change. Estimated blood loss: none.      The duodenal bulb and second portion of the duodenum were normal. Impression:               - Mild Schatzki ring. Dilated. Somewhat patulous EG                            junction.                           - Small hiatal hernia.                           -  Normal duodenal bulb and second portion of the                            duodenum.                           - No specimens collected. Moderate Sedation:      Moderate (conscious) sedation was personally administered by an       anesthesia professional. The following parameters were monitored: oxygen       saturation, heart rate, blood pressure, respiratory rate, EKG, adequacy       of pulmonary ventilation, and response to care. Recommendation:           - Patient has a contact number available for                            emergencies. The signs and symptoms of potential                             delayed complications were discussed with the                            patient. Return to normal activities tomorrow.                            Written discharge instructions were provided to the                            patient.                           - Advance diet as tolerated.                           - Continue present medications.                           - Return to my office in 3 weeks. Right lower lobe                            infiltrate on CT. Differential included aspiration                            pneumonia. Patient clinically, has no pneumonia.                            Clinically, patient has not had pneumonia recently.                            I have recommended he follow-up with the PCP                            regarding abnormal CT findings. No intra-abdominal                            findings on  recent CT. Procedure Code(s):        --- Professional ---                           [phone], Esophagogastroduodenoscopy, flexible,                            transoral; diagnostic, including collection of                            specimen(s) by brushing or washing, when performed                            (separate procedure)                           43450, Dilation of esophagus, by unguided sound or                            bougie, single or multiple passes Diagnosis Code(s):        --- Professional ---                           K22.2, Esophageal obstruction                           K44.9, Diaphragmatic hernia without obstruction or                            gangrene                           R13.10, Dysphagia, unspecified CPT copyright 2022 [employer]. All rights reserved. The codes documented in this report are preliminary and upon coder review may  be revised to meet current compliance requirements. [name]. [name], MD Norvel Richards, MD 02/13/2023 1:48:21 PM This report has been signed  electronically. Number of Addenda: 0

## 2023-02-13 NOTE — Anesthesia Procedure Notes (Signed)
Date/Time: 02/13/2023 1:20 PM  Performed by: Vista Deck, CRNAPre-anesthesia Checklist: Patient identified, Emergency Drugs available, Suction available, Timeout performed and Patient being monitored Patient Re-evaluated:Patient Re-evaluated prior to induction Oxygen Delivery Method: Nasal Cannula

## 2023-02-14 NOTE — Anesthesia Postprocedure Evaluation (Signed)
Anesthesia Post Note  Patient: Antonio [name]  Procedure(s) Performed: ESOPHAGOGASTRODUODENOSCOPY (EGD) WITH PROPOFOL MALONEY DILATION  Patient location during evaluation: Phase II Anesthesia Type: General Level of consciousness: awake Pain management: pain level controlled Vital Signs Assessment: post-procedure vital signs reviewed and stable Respiratory status: spontaneous breathing and respiratory function stable Cardiovascular status: blood pressure returned to baseline and stable Postop Assessment: no headache and no apparent nausea or vomiting Anesthetic complications: no Comments: Late entry   No notable events documented.   Last Vitals:  Vitals:   02/13/23 1341 02/13/23 1348  BP: (!) 108/58 114/77  Pulse: 73 73  Resp: 18 18  Temp:    SpO2: 95% 98%    Last Pain:  Vitals:   02/13/23 1348  TempSrc:   PainSc: 0-No pain                 Windell Norfolk

## 2023-02-21 ENCOUNTER — Encounter (HOSPITAL_COMMUNITY): Payer: Self-pay | Admitting: Internal Medicine

## 2023-02-21 ENCOUNTER — Ambulatory Visit (INDEPENDENT_AMBULATORY_CARE_PROVIDER_SITE_OTHER): Payer: BC Managed Care – PPO | Admitting: Family Medicine

## 2023-02-21 VITALS — BP 135/89 | HR 78 | Ht 64.0 in | Wt 157.0 lb

## 2023-02-21 DIAGNOSIS — R1012 Left upper quadrant pain: Secondary | ICD-10-CM

## 2023-02-21 MED ORDER — FLUTICASONE PROPIONATE 50 MCG/ACT NA SUSP: 2.0000 | Freq: Every day | NASAL | 6 refills | Status: DC

## 2023-02-21 MED ORDER — CETIRIZINE HCL 10 MG PO TABS: 10.0000 mg | ORAL_TABLET | Freq: Every day | ORAL | 0 refills | Status: AC

## 2023-02-21 NOTE — Patient Instructions (Signed)
It was pleasure meeting with you today. Please take medications as prescribed. Follow up with your primary health provider if any health concerns arises. If symptoms worsen please contact your primary care provider and/or visit the emergency department.  

## 2023-02-21 NOTE — Assessment & Plan Note (Addendum)
Patient underwent EGD recently diagnosed with small Hiatal hernia. Advise patient to eat smaller more frequent meals, Avoid fatty foods, alcohol, chocolate caffeine, Maintain a healthy weight, and do not lie down right after eating. Take tylenol for pain. If pain persist visit ED or follow up with GI

## 2023-02-21 NOTE — Progress Notes (Signed)
Patient Office Visit   Subjective   Patient ID: Antonio [name], male    DOB: Jun 22, 2001  Age: 22 y.o. MRN: [id number]  CC:  Chief Complaint  Patient presents with   Abdominal Pain    Patient complains of L flank pain starting 2 days after having an endoscopy. States they said he has a hiatal hernia.     HPI Antonio [name] 22 year old male, presents to the clinic for left upper abdominal pain He  has a past medical history of Environmental allergies (03/25/2013) and Lactose intolerance.  Abdominal Pain This is a new problem. The current episode started in the past 7 days. The problem occurs intermittently. The problem has been gradually improving since onset. The pain is located in the LUQ. The pain is at a severity of 7/10. The quality of the pain is described as aching. The pain radiates to the epigastric region. Pertinent negatives include no constipation, diarrhea, fever, frequency, headaches, melena, nausea or vomiting. Relieved by: rest. He consumes acidic food and caffeinated beverages. Treatments tried: ibuprofen. The treatment provided moderate relief. PMH comments: hiatal hernia      Outpatient Encounter Medications as of 02/21/2023  Medication Sig   fluticasone (FLONASE) 50 MCG/ACT nasal spray Place 2 sprays into both nostrils daily.   ibuprofen (ADVIL) 200 MG tablet Take 400 mg by mouth every 6 (six) hours as needed for moderate pain.   pantoprazole (PROTONIX) 40 MG tablet Take 1 tablet (40 mg total) by mouth daily before breakfast.   cetirizine (ZYRTEC ALLERGY) 10 MG tablet Take 1 tablet (10 mg total) by mouth daily.   [DISCONTINUED] cetirizine (ZYRTEC ALLERGY) 10 MG tablet Take 1 tablet (10 mg total) by mouth daily. (Patient not taking: Reported on 02/21/2023)   No facility-administered encounter medications on file as of 02/21/2023.    Past Surgical History:  Procedure Laterality Date   ESOPHAGOGASTRODUODENOSCOPY (EGD) WITH PROPOFOL N/A 02/13/2023   Procedure:  ESOPHAGOGASTRODUODENOSCOPY (EGD) WITH PROPOFOL;  Surgeon: Corbin Ade, MD;  Location: [facility];  Service: Endoscopy;  Laterality: N/A;  1:30 pm   MALONEY DILATION N/A 02/13/2023   Procedure: MALONEY DILATION;  Surgeon: Corbin Ade, MD;  Location: [facility];  Service: Endoscopy;  Laterality: N/A;   TONSILLECTOMY      Review of Systems  Constitutional:  Negative for chills and fever.  Respiratory:  Negative for shortness of breath.   Cardiovascular:  Negative for chest pain.  Gastrointestinal:  Positive for abdominal pain. Negative for blood in stool, constipation, diarrhea, melena, nausea and vomiting.  Genitourinary:  Negative for frequency.  Neurological:  Negative for dizziness and headaches.      Objective    BP 135/89   Pulse 78   Ht 5\' 4"  (1.626 m)   Wt 157 lb (71.2 kg)   SpO2 98%   BMI 26.95 kg/m   Physical Exam Vitals reviewed.  Constitutional:      General: He is not in acute distress.    Appearance: Normal appearance. He is not ill-appearing, toxic-appearing or diaphoretic.  HENT:     Head: Normocephalic.  Eyes:     General:        Right eye: No discharge.        Left eye: No discharge.     Conjunctiva/sclera: Conjunctivae normal.  Cardiovascular:     Rate and Rhythm: Normal rate.     Pulses: Normal pulses.     Heart sounds: Normal heart sounds.  Pulmonary:  Effort: Pulmonary effort is normal. No respiratory distress.     Breath sounds: Normal breath sounds.  Abdominal:     General: Bowel sounds are normal. There is no distension.     Palpations: Abdomen is soft. There is no mass.     Tenderness: There is no abdominal tenderness. There is no right CVA tenderness, left CVA tenderness or guarding.  Musculoskeletal:        General: Normal range of motion.     Cervical back: Normal range of motion.  Skin:    General: Skin is warm and dry.     Capillary Refill: Capillary refill takes less than 2 seconds.  Neurological:     General: No focal  deficit present.     Mental Status: He is alert and oriented to person, place, and time.     Coordination: Coordination normal.     Gait: Gait normal.  Psychiatric:        Mood and Affect: Mood normal.        Behavior: Behavior normal.        Thought Content: Thought content normal.        Judgment: Judgment normal.       Assessment & Plan:  Left upper quadrant abdominal pain Assessment & Plan: Patient underwent EGD recently diagnosed with small Hiatal hernia. Advise patient to eat smaller more frequent meals, Avoid fatty foods, alcohol, chocolate caffeine, Maintain a healthy weight, and do not lie down right after eating. Take tylenol for pain. If pain persist visit ED or follow up with GI   Other orders -     Cetirizine HCl; Take 1 tablet (10 mg total) by mouth daily.  Dispense: 30 tablet; Refill: 0 -     Fluticasone Propionate; Place 2 sprays into both nostrils daily.  Dispense: 16 g; Refill: 6    No follow-ups on file.   Cruzita Lederer Newman Nip, FNP

## 2023-03-04 ENCOUNTER — Other Ambulatory Visit: Payer: Self-pay

## 2023-03-04 DIAGNOSIS — R748 Abnormal levels of other serum enzymes: Secondary | ICD-10-CM

## 2023-03-17 ENCOUNTER — Ambulatory Visit: Payer: BC Managed Care – PPO | Admitting: Family Medicine

## 2023-03-20 ENCOUNTER — Encounter: Payer: Self-pay | Admitting: Family Medicine

## 2023-03-20 ENCOUNTER — Ambulatory Visit (INDEPENDENT_AMBULATORY_CARE_PROVIDER_SITE_OTHER): Payer: BC Managed Care – PPO | Admitting: Family Medicine

## 2023-03-20 VITALS — BP 128/81 | HR 80 | Ht 64.0 in | Wt 152.0 lb

## 2023-03-20 DIAGNOSIS — T7840XA Allergy, unspecified, initial encounter: Secondary | ICD-10-CM | POA: Insufficient documentation

## 2023-03-20 DIAGNOSIS — J3089 Other allergic rhinitis: Secondary | ICD-10-CM

## 2023-03-20 MED ORDER — FLUTICASONE PROPIONATE 50 MCG/ACT NA SUSP
2.0000 | Freq: Every day | NASAL | 6 refills | Status: AC
Start: 2023-03-20 — End: ?

## 2023-03-20 MED ORDER — PREDNISONE 10 MG PO TABS
10.0000 mg | ORAL_TABLET | Freq: Every day | ORAL | 0 refills | Status: AC
Start: 2023-03-20 — End: 2023-03-25

## 2023-03-20 NOTE — Assessment & Plan Note (Signed)
Prednisone 10 mg x 5 days and Flonase nasal spray. Encouraged non pharmacological interventions such as avoiding allergens.Patient/parent verbalizes understanding regarding plan of care and all questions answered.

## 2023-03-20 NOTE — Patient Instructions (Signed)
        Great to see you today.   - Please take medications as prescribed. - Follow up with your primary health provider if any health concerns arises. - If symptoms worsen please contact your primary care provider and/or visit the emergency department.  

## 2023-03-20 NOTE — Progress Notes (Signed)
Patient Office Visit   Subjective   Patient ID: Antonio [name], male    DOB: [birth year]  Age: 22 y.o. MRN: [id number]  CC:  Chief Complaint  Patient presents with   Nasal Congestion    Patient complains of sneezing, congestion for 10 days. Tried benadryl and zyrtec with no relief.     HPI Antonio [name] 22 year old male, presents to clinic for allergy symptoms. He  has a past medical history of Environmental allergies (03/25/2013) and Lactose intolerance.  Patient complains of evaluation of sneezing. Patient describes symptoms of constant runny nose. Symptoms began 10 days ago and are gradually worsening since that time. Patient denies dyspnea, chest pain, or nausea and vomiting. Treatment thus far include zyrtec and mucinex. Past pulmonary history is significant for asthma.    Outpatient Encounter Medications as of 03/20/2023  Medication Sig   cetirizine (ZYRTEC ALLERGY) 10 MG tablet Take 1 tablet (10 mg total) by mouth daily.   ibuprofen (ADVIL) 200 MG tablet Take 400 mg by mouth every 6 (six) hours as needed for moderate pain.   pantoprazole (PROTONIX) 40 MG tablet Take 1 tablet (40 mg total) by mouth daily before breakfast.   predniSONE (DELTASONE) 10 MG tablet Take 1 tablet (10 mg total) by mouth daily with breakfast for 5 days.   [DISCONTINUED] fluticasone (FLONASE) 50 MCG/ACT nasal spray Place 2 sprays into both nostrils daily.   fluticasone (FLONASE) 50 MCG/ACT nasal spray Place 2 sprays into both nostrils daily.   No facility-administered encounter medications on file as of 03/20/2023.    Past Surgical History:  Procedure Laterality Date   ESOPHAGOGASTRODUODENOSCOPY (EGD) WITH PROPOFOL N/A 02/13/2023   Procedure: ESOPHAGOGASTRODUODENOSCOPY (EGD) WITH PROPOFOL;  Surgeon: Corbin Ade, MD;  [facility]: [facility];  Service: Endoscopy;  Laterality: N/A;  1:30 pm   MALONEY DILATION N/A 02/13/2023   Procedure: MALONEY DILATION;  Surgeon: Corbin Ade, MD;  [facility];  Service: Endoscopy;  Laterality: N/A;   TONSILLECTOMY      Review of Systems  Constitutional:  Negative for chills and fever.  HENT:  Positive for congestion. Negative for sinus pain and sore throat.   Eyes:  Negative for blurred vision.  Respiratory:  Negative for shortness of breath.   Cardiovascular:  Negative for chest pain.  Gastrointestinal:  Negative for abdominal pain and vomiting.  Genitourinary:  Negative for dysuria.  Musculoskeletal:  Negative for myalgias.  Neurological:  Negative for dizziness and headaches.      Objective    BP 128/81   Pulse 80   Ht 5\' 4"  (1.626 m)   Wt 152 lb (68.9 kg)   SpO2 96%   BMI 26.09 kg/m   Physical Exam Vitals reviewed.  Constitutional:      General: He is not in acute distress.    Appearance: Normal appearance. He is not ill-appearing, toxic-appearing or diaphoretic.  HENT:     Head: Normocephalic.     Nose: Congestion and rhinorrhea present.  Eyes:     General:        Right eye: No discharge.        Left eye: No discharge.     Conjunctiva/sclera: Conjunctivae normal.  Cardiovascular:     Rate and Rhythm: Normal rate.     Pulses: Normal pulses.     Heart sounds: Normal heart sounds.  Pulmonary:     Effort: Pulmonary effort is normal. No respiratory distress.     Breath sounds: Normal  breath sounds.  Abdominal:     General: Bowel sounds are normal.     Palpations: Abdomen is soft.     Tenderness: There is no abdominal tenderness. There is no right CVA tenderness, left CVA tenderness or guarding.  Musculoskeletal:        General: Normal range of motion.     Cervical back: Normal range of motion.  Skin:    General: Skin is warm and dry.     Capillary Refill: Capillary refill takes less than 2 seconds.  Neurological:     General: No focal deficit present.     Mental Status: He is alert and oriented to person, place, and time.     Coordination: Coordination normal.     Gait: Gait normal.  Psychiatric:         Mood and Affect: Mood normal.        Behavior: Behavior normal.       Assessment & Plan:  Allergy, initial encounter Assessment & Plan: Prednisone 10 mg x 5 days and Flonase nasal spray. Encouraged non pharmacological interventions such as avoiding allergens.Patient/parent verbalizes understanding regarding plan of care and all questions answered.   Orders: -     predniSONE; Take 1 tablet (10 mg total) by mouth daily with breakfast for 5 days.  Dispense: 5 tablet; Refill: 0 -     Fluticasone Propionate; Place 2 sprays into both nostrils daily.  Dispense: 16 g; Refill: 6   No follow-ups on file.   Antonio Lederer Newman Nip, FNP

## 2023-04-08 ENCOUNTER — Encounter: Payer: Self-pay | Admitting: Gastroenterology

## 2023-05-19 ENCOUNTER — Telehealth: Payer: Self-pay | Admitting: Family Medicine

## 2023-05-19 NOTE — Telephone Encounter (Signed)
error 

## 2023-05-21 ENCOUNTER — Telehealth: Payer: Self-pay

## 2023-05-21 ENCOUNTER — Ambulatory Visit: Payer: BC Managed Care – PPO | Admitting: Family Medicine

## 2023-05-21 MED ORDER — PANTOPRAZOLE SODIUM 40 MG PO TBEC: 40.0000 mg | DELAYED_RELEASE_TABLET | Freq: Every day | ORAL | 5 refills | Status: DC

## 2023-05-21 NOTE — Addendum Note (Signed)
Addended by: Tiffany Kocher on: 05/21/2023 05:22 PM   Modules accepted: Orders

## 2023-05-21 NOTE — Telephone Encounter (Signed)
Pt called requesting refills on pantoprazole. Pt was last seen on 02/04/23

## 2023-05-26 ENCOUNTER — Ambulatory Visit (INDEPENDENT_AMBULATORY_CARE_PROVIDER_SITE_OTHER): Payer: BC Managed Care – PPO | Admitting: Family Medicine

## 2023-05-26 ENCOUNTER — Encounter: Payer: Self-pay | Admitting: Family Medicine

## 2023-05-26 VITALS — BP 138/75 | HR 60 | Ht 64.0 in | Wt 161.0 lb

## 2023-05-26 DIAGNOSIS — R12 Heartburn: Secondary | ICD-10-CM | POA: Diagnosis not present

## 2023-05-26 MED ORDER — PANTOPRAZOLE SODIUM 40 MG PO TBEC: 40.0000 mg | DELAYED_RELEASE_TABLET | Freq: Every day | ORAL | 3 refills | Status: DC

## 2023-05-26 NOTE — Assessment & Plan Note (Signed)
Refilled Protonix 40 mg Explained to patient lifestyle modifications, weight loss, smoking cessation, loose clothing, and avoiding caffeine and trigger foods.

## 2023-05-26 NOTE — Progress Notes (Signed)
Patient Office Visit   Subjective   Patient ID: Antonio [name], male    DOB: [birth year]  Age: 22 y.o. MRN: [id number]  CC:  Chief Complaint  Patient presents with   Follow-up    HPI Antonio [name] 22 year old male, presents to the clinic for follow up. He  has a past medical history of Environmental allergies (03/25/2013) and Lactose intolerance.For the details of today's visit, please refer to assessment and plan.   HPI    Outpatient Encounter Medications as of 05/26/2023  Medication Sig   cetirizine (ZYRTEC ALLERGY) 10 MG tablet Take 1 tablet (10 mg total) by mouth daily.   fluticasone (FLONASE) 50 MCG/ACT nasal spray Place 2 sprays into both nostrils daily.   ibuprofen (ADVIL) 200 MG tablet Take 400 mg by mouth every 6 (six) hours as needed for moderate pain.   [DISCONTINUED] pantoprazole (PROTONIX) 40 MG tablet Take 1 tablet (40 mg total) by mouth daily before breakfast.   pantoprazole (PROTONIX) 40 MG tablet Take 1 tablet (40 mg total) by mouth daily before breakfast.   No facility-administered encounter medications on file as of 05/26/2023.    Past Surgical History:  Procedure Laterality Date   ESOPHAGOGASTRODUODENOSCOPY (EGD) WITH PROPOFOL N/A 02/13/2023   Procedure: ESOPHAGOGASTRODUODENOSCOPY (EGD) WITH PROPOFOL;  Surgeon: Corbin Ade, MD;  [facility]: [facility];  Service: Endoscopy;  Laterality: N/A;  1:30 pm   MALONEY DILATION N/A 02/13/2023   Procedure: MALONEY DILATION;  Surgeon: Corbin Ade, MD;  [facility]: [facility];  Service: Endoscopy;  Laterality: N/A;   TONSILLECTOMY      Review of Systems  Respiratory:  Negative for shortness of breath.   Cardiovascular:  Negative for chest pain.  Gastrointestinal:  Negative for abdominal pain.  Genitourinary:  Negative for dysuria.  Neurological:  Negative for dizziness and headaches.      Objective    BP 138/75   Pulse 60   Ht 5\' 4"  (1.626 m)   Wt 161 lb (73 kg)   SpO2 95%   BMI 27.64 kg/m    Physical Exam Vitals reviewed.  Constitutional:      General: He is not in acute distress.    Appearance: Normal appearance. He is not ill-appearing, toxic-appearing or diaphoretic.  HENT:     Head: Normocephalic.  Eyes:     General:        Right eye: No discharge.        Left eye: No discharge.     Conjunctiva/sclera: Conjunctivae normal.  Cardiovascular:     Rate and Rhythm: Normal rate.     Pulses: Normal pulses.     Heart sounds: Normal heart sounds.  Pulmonary:     Effort: Pulmonary effort is normal. No respiratory distress.     Breath sounds: Normal breath sounds.  Abdominal:     General: Bowel sounds are normal. There is no distension.     Palpations: Abdomen is soft.     Tenderness: There is no abdominal tenderness. There is no guarding.  Musculoskeletal:        General: Normal range of motion.     Cervical back: Normal range of motion.  Skin:    General: Skin is warm and dry.     Capillary Refill: Capillary refill takes less than 2 seconds.  Neurological:     General: No focal deficit present.     Mental Status: He is alert and oriented to person, place, and time.  Coordination: Coordination normal.     Gait: Gait normal.  Psychiatric:        Mood and Affect: Mood normal.        Behavior: Behavior normal.       Assessment & Plan:  Heartburn Assessment & Plan: Refilled Protonix 40 mg Explained to patient lifestyle modifications, weight loss, smoking cessation, loose clothing, and avoiding caffeine and trigger foods.    Other orders -     Pantoprazole Sodium; Take 1 tablet (40 mg total) by mouth daily before breakfast.  Dispense: 30 tablet; Refill: 3    Return if symptoms worsen or fail to improve.   Cruzita Lederer Newman Nip, FNP

## 2023-05-26 NOTE — Patient Instructions (Signed)
        Great to see you today.  I have refilled the medication(s) we provide.    - Please take medications as prescribed. - Follow up with your primary health provider if any health concerns arises. - If symptoms worsen please contact your primary care provider and/or visit the emergency department.  

## 2023-07-24 ENCOUNTER — Encounter: Payer: Self-pay | Admitting: *Deleted

## 2023-10-03 ENCOUNTER — Ambulatory Visit: Payer: Self-pay | Admitting: Family Medicine

## 2023-10-03 NOTE — Telephone Encounter (Signed)
Agree with plan 

## 2023-10-03 NOTE — Telephone Encounter (Signed)
   Chief Complaint: chest pain Symptoms: chest pain x5 days Frequency: comes and goes, lasts minutes Pertinent Negatives: Patient denies fever, SOB, vomiting Disposition: [] ED /[] Urgent Care (no appt availability in office) / [x] Appointment(In office/virtual)/ []  [facility]/ [] Home Care/ [] Refused Recommended Disposition /[] [facility]/ []  Follow-up with PCP Additional Notes: Patient called in reporting he has been experiencing mild chest pain x5 days. Patient reports he recently had an endoscopy and was dx with acid reflux and thinks his current symptoms may be related. Patient reports he attempted to get in touch with GI regarding his symptoms but has not received a call back. Per protocol this RN attempted to schedule patient today 11/22, patient reports he is unable to be seen until Monday due to scheduling conflicts and is requesting an appt then. This RN scheduled appt for Mon 11/25. This RN advised patient to call back if chest pain worsens or SOB. Patient verbalized understanding.   Copied from CRM [id number]. Topic: Clinical - Red Word Triage >> Oct 03, 2023  9:53 AM Antonio [name] wrote: Red Word that prompted transfer to Nurse Triage: Chest pain - Started 4-5 days ago Reason for Disposition  [1] Patient says chest pain feels exactly the same as previously diagnosed "heartburn" AND [2] describes burning in chest AND [3] accompanying sour taste in mouth  Answer Assessment - Initial Assessment Questions 1. LOCATION: "Where does it hurt?"       middle 2. RADIATION: "Does the pain go anywhere else?" (e.g., into neck, jaw, arms, back)     no 3. ONSET: "When did the chest pain begin?" (Minutes, hours or days)      5 days ago 4. PATTERN: "Does the pain come and go, or has it been constant since it started?"  "Does it get worse with exertion?"      Comes and goes 5. DURATION: "How long does it last" (e.g., seconds, minutes, hours)     minutes 6. SEVERITY: "How bad is the  pain?"  (e.g., Scale 1-10; mild, moderate, or severe)    - MILD (1-3): doesn't interfere with normal activities     - MODERATE (4-7): interferes with normal activities or awakens from sleep    - SEVERE (8-10): excruciating pain, unable to do any normal activities       mild 7. CARDIAC RISK FACTORS: "Do you have any history of heart problems or risk factors for heart disease?" (e.g., angina, prior heart attack; diabetes, high blood pressure, high cholesterol, smoker, or strong family history of heart disease)     no 8. PULMONARY RISK FACTORS: "Do you have any history of lung disease?"  (e.g., blood clots in lung, asthma, emphysema, birth control pills)     no 9. CAUSE: "What do you think is causing the chest pain?"     I'm not sure if it's from my acid reflux or not 10. OTHER SYMPTOMS: "Do you have any other symptoms?" (e.g., dizziness, nausea, vomiting, sweating, fever, difficulty breathing, cough)       no  Protocols used: Chest Pain-A-AH

## 2023-10-06 ENCOUNTER — Ambulatory Visit (INDEPENDENT_AMBULATORY_CARE_PROVIDER_SITE_OTHER): Payer: Self-pay | Admitting: Family Medicine

## 2023-10-06 VITALS — BP 137/80 | HR 72 | Ht 64.0 in | Wt 173.0 lb

## 2023-10-06 DIAGNOSIS — R079 Chest pain, unspecified: Secondary | ICD-10-CM

## 2023-10-06 DIAGNOSIS — T148XXA Other injury of unspecified body region, initial encounter: Secondary | ICD-10-CM | POA: Insufficient documentation

## 2023-10-06 MED ORDER — TIZANIDINE HCL 4 MG PO CAPS
4.0000 mg | ORAL_CAPSULE | Freq: Every evening | ORAL | 0 refills | Status: DC | PRN
Start: 2023-10-06 — End: 2024-07-01

## 2023-10-06 NOTE — Assessment & Plan Note (Signed)
The patient's symptoms are likely due to muscle strain. I encouraged the following: Rest and avoidance of activities that strain the chest muscles. Application of heat and cold therapy to relax the muscles. Taking over-the-counter ibuprofen or Tylenol to help reduce pain and inflammation. Gentle stretching exercises to help restore muscle flexibility and strength. Additionally, Zanaflex 4 mg has been prescribed to be taken at bedtime.

## 2023-10-06 NOTE — Progress Notes (Signed)
Acute Office Visit  Subjective:    Patient ID: Antonio [name], male    DOB: 02/13/2001, 22 y.o.   MRN: [id number]  Chief Complaint  Patient presents with   Chest Pain    Pt reports chest pain x 6-7  days . Reports pressure in his chest and back pain.     HPI The patient presents today with complaints of chest pain that began last Thursday, 10/02/2023. He reports radiating pain in the chest when reaching for objects or stretching his lower back. He describes a pulling sensation with certain activities or movements. The chest pain only occurs with specific movements or activities.The patient denies shortness of breath, sweating, nausea, vomiting, dizziness, or lightheadedness. He has no reported cardiovascular history. Notably, the patient has a history of GERD (gastroesophageal reflux disease).    Past Medical History:  Diagnosis Date   Environmental allergies 03/25/2013   Lactose intolerance     Past Surgical History:  Procedure Laterality Date   ESOPHAGOGASTRODUODENOSCOPY (EGD) WITH PROPOFOL N/A 02/13/2023   Procedure: ESOPHAGOGASTRODUODENOSCOPY (EGD) WITH PROPOFOL;  Surgeon: Corbin Ade, MD;  Location: [facility];  Service: Endoscopy;  Laterality: N/A;  1:30 pm   MALONEY DILATION N/A 02/13/2023   Procedure: MALONEY DILATION;  Surgeon: Corbin Ade, MD;  Location: [facility];  Service: Endoscopy;  Laterality: N/A;   TONSILLECTOMY      Family History  Problem Relation Age of Onset   Healthy Mother    Healthy Father    Healthy Sister    Hypertension Paternal Grandmother    Cancer Paternal Grandfather        prostate    Social History   Socioeconomic History   Marital status: Single    Spouse name: Not on file   Number of children: Not on file   Years of education: Not on file   Highest education level: GED or equivalent  Occupational History   Not on file  Tobacco Use   Smoking status: Never    Passive exposure: Yes   Smokeless tobacco: Never  Vaping  Use   Vaping status: Some Days  Substance and Sexual Activity   Alcohol use: No   Drug use: Yes    Types: Marijuana    Comment: last used 01/10/23   Sexual activity: Not on file  Other Topics Concern   Not on file  Social History Narrative   Lives with parents and siblings      Graduated from high school 2022, currently in [employer]    Social Determinants of Health   Financial Resource Strain: Low Risk  (10/06/2023)   Overall Financial Resource Strain (CARDIA)    Difficulty of Paying Living Expenses: Not very hard  Food Insecurity: Food Insecurity Present (10/06/2023)   Hunger Vital Sign    Worried About Running Out of Food in the Last Year: Sometimes true    Ran Out of Food in the Last Year: Sometimes true  Transportation Needs: No Transportation Needs (10/06/2023)   PRAPARE - Administrator, Civil Service (Medical): No    Lack of Transportation (Non-Medical): No  Physical Activity: Sufficiently Active (10/06/2023)   Exercise Vital Sign    Days of Exercise per Week: 7 days    Minutes of Exercise per Session: 120 min  Stress: Stress Concern Present (10/06/2023)   [employer] of Occupational Health - Occupational Stress Questionnaire    Feeling of Stress : Rather much  Social Connections: Unknown (10/06/2023)   Social  Connection and Isolation Panel [NHANES]    Frequency of Communication with Friends and Family: Once a week    Frequency of Social Gatherings with Friends and Family: Once a week    Attends Religious Services: Patient declined    Database administrator or Organizations: No    Attends Engineer, structural: Not on file    Marital Status: Never married  Intimate Partner Violence: Not on file    Outpatient Medications Prior to Visit  Medication Sig Dispense Refill   cetirizine (ZYRTEC ALLERGY) 10 MG tablet Take 1 tablet (10 mg total) by mouth daily. 30 tablet 0   fluticasone (FLONASE) 50 MCG/ACT nasal spray Place 2 sprays into both  nostrils daily. 16 g 6   ibuprofen (ADVIL) 200 MG tablet Take 400 mg by mouth every 6 (six) hours as needed for moderate pain.     pantoprazole (PROTONIX) 40 MG tablet Take 1 tablet (40 mg total) by mouth daily before breakfast. 30 tablet 3   No facility-administered medications prior to visit.    No Known Allergies  Review of Systems  Constitutional:  Negative for fatigue and fever.  Eyes:  Negative for visual disturbance.  Respiratory:  Negative for chest tightness and shortness of breath.   Cardiovascular:  Negative for chest pain and palpitations.  Neurological:  Negative for dizziness and headaches.       Objective:    Physical Exam HENT:     Head: Normocephalic.     Right Ear: External ear normal.     Left Ear: External ear normal.     Nose: No congestion or rhinorrhea.     Mouth/Throat:     Mouth: Mucous membranes are moist.  Cardiovascular:     Rate and Rhythm: Regular rhythm.     Heart sounds: No murmur heard. Pulmonary:     Effort: No respiratory distress.     Breath sounds: Normal breath sounds.  Neurological:     Mental Status: He is alert.     BP 137/80   Pulse 72   Ht 5\' 4"  (1.626 m)   Wt 173 lb (78.5 kg)   SpO2 96%   BMI 29.70 kg/m  Wt Readings from Last 3 Encounters:  10/06/23 173 lb (78.5 kg)  05/26/23 161 lb (73 kg)  03/20/23 152 lb (68.9 kg)       Assessment & Plan:  Muscle strain Assessment & Plan: The patient's symptoms are likely due to muscle strain. I encouraged the following: Rest and avoidance of activities that strain the chest muscles. Application of heat and cold therapy to relax the muscles. Taking over-the-counter ibuprofen or Tylenol to help reduce pain and inflammation. Gentle stretching exercises to help restore muscle flexibility and strength. Additionally, Zanaflex 4 mg has been prescribed to be taken at bedtime.    Chest pain, unspecified type -     EKG 12-Lead -     tiZANidine HCl; Take 1 capsule (4 mg total) by  mouth at bedtime as needed for muscle spasms.  Dispense: 30 capsule; Refill: 0   Note: This chart has been completed using Engineer, civil (consulting) software, and while attempts have been made to ensure accuracy, certain words and phrases may not be transcribed as intended.   Gilmore Laroche, FNP

## 2023-10-06 NOTE — Patient Instructions (Addendum)
I appreciate the opportunity to provide care to you today!    Follow up:  with pcp  Chest pain from muscle strain occurs when the muscles in the chest wall are overused, injured, or strained. This pain is typically localized to the muscles around the chest, ribs, or upper back and is often caused by activities that stress these muscles.  Management: Rest: Avoid activities that strain the chest muscles. Ice or Heat: Apply an ice pack during the first 48 hours to reduce inflammation, then switch to a heating pad to relax the muscles. Over-the-counter pain relievers: Ibuprofen or acetaminophen can help relieve pain and reduce inflammation. Gentle Stretching: Once the pain starts to subside, gentle stretching exercises can help restore muscle flexibility and strength   Please seek urgent care if you experience any of the following:  1. Chest Pain with Pressure or Tightness A sensation of pressure, tightness, or heaviness in the chest, especially if it feels like an elephant is sitting on your chest, could be a sign of a heart attack. 2. Pain Radiating to Other Areas If the chest pain spreads to the arms, jaw, neck, back, or stomach, it could indicate a heart attack. Pain often radiates to the left arm, but it can occur in either arm or other areas. 3. Shortness of Breath Difficulty breathing or feeling out of breath without exertion, especially when combined with chest pain, can be a sign of a serious cardiovascular problem. 4. Sweating and Cold Clammy Skin Breaking out into a cold sweat or experiencing clammy skin alongside chest pain is a serious warning sign and requires immediate medical attention. 5. Dizziness, Lightheadedness, or Fainting Feeling lightheaded, dizzy, or about to faint, especially when associated with chest pain, can suggest a problem with heart function, such as a heart attack or arrhythmia. 6. Nausea or Vomiting Feeling nauseous or actually vomiting, along with chest  pain, can be symptoms of a heart attack, particularly in women. 7. Irregular or Rapid Heartbeat Palpitations or an irregular heartbeat (feeling like your heart is racing or skipping beats) can be a sign of arrhythmia, a serious heart condition. 8. Sudden Weakness or Fatigue Unexplained weakness or a sudden feeling of extreme fatigue with chest pain may indicate that the heart is not pumping efficiently. 9. Pain Worse with Physical Activity or Stress Chest pain that worsens with physical exertion or stress could be due to angina or heart-related issues. 10. Feeling of Impending Doom A sensation of an impending life-threatening event or extreme anxiety without a clear cause, especially with chest pain, should be taken seriously.     Please continue to a heart-healthy diet and increase your physical activities. Try to exercise for at least five days a week.    It was a pleasure to see you and I look forward to continuing to work together on your health and well-being. Please do not hesitate to call the office if you need care or have questions about your care.  In case of emergency, please visit the Emergency Department for urgent care, or contact our clinic at [phone] to schedule an appointment. We're here to help you!   Have a wonderful day and week. With Gratitude, Gilmore Laroche MSN, FNP-BC

## 2023-10-08 ENCOUNTER — Encounter (HOSPITAL_BASED_OUTPATIENT_CLINIC_OR_DEPARTMENT_OTHER): Payer: Self-pay | Admitting: Emergency Medicine

## 2023-10-08 ENCOUNTER — Other Ambulatory Visit: Payer: Self-pay

## 2023-10-08 ENCOUNTER — Emergency Department (HOSPITAL_BASED_OUTPATIENT_CLINIC_OR_DEPARTMENT_OTHER)
Admission: EM | Admit: 2023-10-08 | Discharge: 2023-10-08 | Disposition: A | Discharge status: Home / Self Care | Payer: BC Managed Care – PPO | Attending: Emergency Medicine | Admitting: Emergency Medicine

## 2023-10-08 ENCOUNTER — Other Ambulatory Visit (HOSPITAL_BASED_OUTPATIENT_CLINIC_OR_DEPARTMENT_OTHER): Payer: Self-pay

## 2023-10-08 ENCOUNTER — Emergency Department (HOSPITAL_BASED_OUTPATIENT_CLINIC_OR_DEPARTMENT_OTHER): Payer: BC Managed Care – PPO | Admitting: Radiology

## 2023-10-08 DIAGNOSIS — R0789 Other chest pain: Secondary | ICD-10-CM | POA: Diagnosis not present

## 2023-10-08 DIAGNOSIS — R110 Nausea: Secondary | ICD-10-CM | POA: Diagnosis not present

## 2023-10-08 DIAGNOSIS — R079 Chest pain, unspecified: Secondary | ICD-10-CM | POA: Diagnosis not present

## 2023-10-08 LAB — COMPREHENSIVE METABOLIC PANEL
ALT: 38 U/L (ref 0–44)
AST: 43 U/L — ABNORMAL HIGH (ref 15–41)
Albumin: 4.6 g/dL (ref 3.5–5.0)
Alkaline Phosphatase: 56 U/L (ref 38–126)
Anion gap: 7 (ref 5–15)
BUN: 12 mg/dL (ref 6–20)
CO2: 27 mmol/L (ref 22–32)
Calcium: 9.6 mg/dL (ref 8.9–10.3)
Chloride: 104 mmol/L (ref 98–111)
Creatinine, Ser: 0.86 mg/dL (ref 0.61–1.24)
GFR, Estimated: >60 mL/min (ref >60)
Glucose, Bld: 89 mg/dL (ref 70–99)
Potassium: 4.1 mmol/L (ref 3.5–5.1)
Sodium: 138 mmol/L (ref 135–145)
Total Bilirubin: 1 mg/dL (ref <1.2)
Total Protein: 7.5 g/dL (ref 6.5–8.1)

## 2023-10-08 LAB — CBC WITH DIFFERENTIAL/PLATELET
Abs Immature Granulocytes: 0.01 10*3/uL (ref 0.00–0.07)
Basophils Absolute: 0 10*3/uL (ref 0.0–0.1)
Basophils Relative: 1 %
Eosinophils Absolute: 0 10*3/uL (ref 0.0–0.5)
Eosinophils Relative: 1 %
HCT: 42.8 % (ref 39.0–52.0)
Hemoglobin: 14.8 g/dL (ref 13.0–17.0)
Immature Granulocytes: 0 %
Lymphocytes Relative: 44 %
Lymphs Abs: 1.8 10*3/uL (ref 0.7–4.0)
MCH: 28.1 pg (ref 26.0–34.0)
MCHC: 34.6 g/dL (ref 30.0–36.0)
MCV: 81.4 fL (ref 80.0–100.0)
Monocytes Absolute: 0.4 10*3/uL (ref 0.1–1.0)
Monocytes Relative: 10 %
Neutro Abs: 1.9 10*3/uL (ref 1.7–7.7)
Neutrophils Relative %: 44 %
Platelets: 210 10*3/uL (ref 150–400)
RBC: 5.26 MIL/uL (ref 4.22–5.81)
RDW: 12.6 % (ref 11.5–15.5)
WBC: 4.1 10*3/uL (ref 4.0–10.5)
nRBC: 0 % (ref 0.0–0.2)

## 2023-10-08 LAB — TROPONIN I (HIGH SENSITIVITY): Troponin I (High Sensitivity): 5 ng/L (ref <18)

## 2023-10-08 MED ORDER — LIDOCAINE 5 % EX PTCH
1.0000 | MEDICATED_PATCH | CUTANEOUS | Status: DC
  Administered 2023-10-08: 1 via TRANSDERMAL
  Filled 2023-10-08: qty 1

## 2023-10-08 MED ORDER — DIAZEPAM 5 MG/ML IJ SOLN
2.5000 mg | Freq: Once | INTRAMUSCULAR | Status: AC
  Administered 2023-10-08: 2.5 mg via INTRAVENOUS
  Filled 2023-10-08: qty 2

## 2023-10-08 MED ORDER — KETOROLAC TROMETHAMINE 15 MG/ML IJ SOLN
15.0000 mg | Freq: Once | INTRAMUSCULAR | Status: AC
  Administered 2023-10-08: 15 mg via INTRAVENOUS
  Filled 2023-10-08: qty 1

## 2023-10-08 NOTE — ED Provider Notes (Signed)
[facility] EMERGENCY DEPARTMENT AT [facility] Provider Note   CSN: [id number] Arrival date & time: 10/08/23  1431     History Chief Complaint  Patient presents with   Chest Pain    Antonio [name] is a 22 y.o. male with history of GERD presents emerged from today for evaluation of left-sided chest pains been hurting since 09-29-2023.  Patient reports it is worse with certain movements.  Not worse with any deep breathing or upon exertion or ambulation.  Reports that his pain was relieved with some muscle laxer as he was given on Monday however we will add evenings at night and was still experience the pain in the mornings.  He denies any pain with inspiration.  Denies any fever, shortness breath, leg swelling, or hemoptysis.  He describes it as a cramping feeling in his chest stable occasionally, some nausea but no vomiting or any abdominal pain.  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  He denies any radiation of the pain.  Denies any palpitations or any cough or cold symptoms recently.  He reports he tried Tylenol once minimal relief. NKDA.  Denies any tobacco, EtOH illicit drug use.   Chest Pain Associated symptoms: no abdominal pain, no back pain, no cough, no fever, no palpitations, no shortness of breath and no vomiting        Home Medications Prior to Admission medications   Medication Sig Start Date End Date Taking? Authorizing Provider  cetirizine (ZYRTEC ALLERGY) 10 MG tablet Take 1 tablet (10 mg total) by mouth daily. 02/21/23   Del Nigel Berthold, FNP  fluticasone (FLONASE) 50 MCG/ACT nasal spray Place 2 sprays into both nostrils daily. 03/20/23   Del Nigel Berthold, FNP  ibuprofen (ADVIL) 200 MG tablet Take 400 mg by mouth every 6 (six) hours as needed for moderate pain.    [provider]  pantoprazole (PROTONIX) 40 MG tablet Take 1 tablet (40 mg  total) by mouth daily before breakfast. 05/26/23   Del Newman Nip, Tenna Child, FNP  tiZANidine (ZANAFLEX) 4 MG capsule Take 1 capsule (4 mg total) by mouth at bedtime as needed for muscle spasms. 10/06/23   Gilmore Laroche, FNP      Allergies    Patient has no known allergies.    Review of Systems   Review of Systems  Constitutional:  Negative for chills and fever.  Respiratory:  Negative for cough and shortness of breath.   Cardiovascular:  Positive for chest pain. Negative for palpitations and leg swelling.  Gastrointestinal:  Negative for abdominal pain and vomiting.  Musculoskeletal:  Negative for back pain.    Physical Exam Updated Vital Signs BP (!) 152/99 (BP Location: Right Arm)   Pulse 74   Temp 98 F (36.7 C) (Oral)   Resp 18   Ht 5\' 4"  (1.626 m)   Wt 78.5 kg   SpO2 100%   BMI 29.70 kg/m  Physical Exam Vitals and nursing note reviewed.  Constitutional:      General: He is not in acute distress.    Appearance: He is not ill-appearing or toxic-appearing.  Cardiovascular:     Rate and Rhythm: Normal rate.     Pulses:          Radial pulses are 2+ on the right side and 2+ on the left side.  Dorsalis pedis pulses are 2+ on the right side and 2+ on the left side.       Posterior tibial pulses are 2+ on the right side and 2+ on the left side.  Pulmonary:     Effort: Pulmonary effort is normal. No respiratory distress.     Breath sounds: No decreased breath sounds.  Chest:     Chest wall: Tenderness present.     Comments: No overlying skin changes noted. Tenderness to the left upper chest with palpable muscle spasm.  Abdominal:     Palpations: Abdomen is soft.     Tenderness: There is no abdominal tenderness.  Musculoskeletal:     Right lower leg: No tenderness. No edema.     Left lower leg: No tenderness. No edema.  Neurological:     Mental Status: He is alert.     ED Results / Procedures / Treatments   Labs (all labs ordered are listed, but only  abnormal results are displayed) Labs Reviewed  COMPREHENSIVE METABOLIC PANEL - Abnormal; Notable for the following components:      Result Value   AST 43 (*)    All other components within normal limits  CBC WITH DIFFERENTIAL/PLATELET  TROPONIN I (HIGH SENSITIVITY)    EKG EKG Interpretation Date/Time:  Wednesday October 08 2023 14:57:08 EST Ventricular Rate:  66 PR Interval:  159 QRS Duration:  86 QT Interval:  394 QTC Calculation: 413 R Axis:   58  Text Interpretation: Sinus rhythm ST elev, probable normal early repol pattern Confirmed by Estelle June [phone]) on 10/08/2023 4:07:37 PM  Radiology DG Chest 2 View  Result Date: 10/08/2023 CLINICAL DATA:  Chest pain EXAM: CHEST - 2 VIEW COMPARISON:  07/19/2018 FINDINGS: The heart size and mediastinal contours are within normal limits. Both lungs are clear. The visualized skeletal structures are unremarkable. IMPRESSION: No active cardiopulmonary disease. Electronically Signed   By: Judie Petit.  Shick M.D.   On: 10/08/2023 15:43    Procedures Procedures   Medications Ordered in ED Medications  lidocaine (LIDODERM) 5 % 1 patch (1 patch Transdermal Patch Applied 10/08/23 1603)  ketorolac (TORADOL) 15 MG/ML injection 15 mg (15 mg Intravenous Given 10/08/23 1603)  diazepam (VALIUM) injection 2.5 mg (2.5 mg Intravenous Given 10/08/23 1603)    ED Course/ Medical Decision Making/ A&P    Medical Decision Making Amount and/or Complexity of Data Reviewed Labs: ordered. Radiology: ordered.  Risk Prescription drug management.   22 y.o. male presents to the ER for evaluation of chest pain. Differential diagnosis includes but is not limited to ACS, pericarditis, myocarditis, aortic dissection, PE, pneumothorax, esophageal spasm or rupture, chronic angina, pneumonia, bronchitis, GERD, reflux/PUD, biliary disease, pancreatitis, costochondritis, anxiety. Vital signs mildly elevated BP, otherwise unremarkable. Physical exam as noted above.   I  independently reviewed and interpreted the patient's labs. CBC unremarkable. CMP shows mild elevated AST at 43, otherwise unremarkable.Troponin at 5.  CXR shows no acute cardiopulmonary process. Per radiologist's interpretation.   EKG reviewed and interpreted by my attending and read as Sinus rhythm ST elev, probable normal early repol pattern.  The patient was given lidocaine patch, small dose of valium, and IV toradol for symptoms.   On re-evaluation, patient reports that he is feeling much better after the medications.   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 He has a palpable muscle spasm in the upper left chest. He has palpable radial pulses bilaterally. No overlying skin changes. BP mildly elevated. Does  not appear in any acute distress. Lungs are clear. He does not have any recent cough or cold symptoms.  I doubt this is any ACS given reassuring EKG and troponins.  Less likely pneumonia or pneumothorax given reassuring chest x-ray as well as lack of cough and cold symptoms less likely dissection or aneurysm given duration as well as presentation.  Patient is still able to eat and drink, doubt any esophageal spasm or rupture.  He has no tenderness to the belly, doubt any biliary disease or pancreatitis.  Recommended he still continue take the muscle laxers given by UC but add on the Tylenol and lidocaine patches to this.  Would not recommend daily ibuprofen use given his history of acid reflux.  Given work note as well as the likely need to rest this in order for her to heal.  Stable for discharge home with outpatient follow-up.  We discussed the results of the labs/imaging. The plan is supportive care measures, follow-up PCP. We discussed strict return precautions and red flag symptoms. The patient verbalized their understanding and agrees to the plan. The patient is stable and being discharged home in good condition.  Portions of this report may have  been transcribed using voice recognition software. Every effort was made to ensure accuracy; however, inadvertent computerized transcription errors may be present.    Final Clinical Impression(s) / ED Diagnoses Final diagnoses:  Chest wall pain    Rx / DC Orders ED Discharge Orders     None         Achille Rich, PA-C 10/08/23 1615    Ernie Avena, MD 10/14/23 1325

## 2023-10-08 NOTE — ED Notes (Signed)
Patient transported to X-ray 

## 2023-10-08 NOTE — ED Triage Notes (Signed)
C/o intermittent central chest pain since 11/18. Was seen at [facility] on the 18th and was given muscle relaxer and has been taking them with no relief.

## 2023-10-08 NOTE — Discharge Instructions (Addendum)
You were seen in the ER today for evaluation of your chest pain.  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  I do recommend you continue taking the muscle laxer's that were given to you at urgent care.  I was recommend adding on Tylenol to this as well.  Recommend taking at 1000 mg every 6 hours as needed for pain.  Can also try over-the-counter topical lidocaine patches to the area.  The best thing for this is make sure you are staying well-hydrated.  Can also get massages to the area as well.  Do not apply massage gun to the area though.  Make sure to try some gentle stretching.  Make sure to follow up with your primary care doctor for reevaluation.  If any concerns with new or worsening symptoms, please return to the nearest emergency room for evaluation.  Contact a doctor if: You have a fever. Your chest pain gets worse. You have new symptoms. Get help right away if: You feel sick to your stomach (nauseous) or you throw up (vomit). You feel sweaty or light-headed. You have a cough with mucus from your lungs (sputum) or you cough up blood. You are short of breath. These symptoms may be an emergency. Do not wait to see if the symptoms will go away. Get medical help right away. Call your local emergency services (911 in the [place] Do not drive yourself to the hospital.

## 2023-11-25 NOTE — Patient Instructions (Signed)

## 2023-11-25 NOTE — Progress Notes (Signed)
   Established Patient Office Visit   Subjective  Patient ID: Antonio [name], male    DOB: 08/03/2001  Age: 23 y.o. MRN: [id number]  Chief Complaint  Patient presents with   Follow-up    PCP Follow up from visit w/ Antonio [name] on 11/25 for Muscle Strain    He  has a past medical history of Environmental allergies (03/25/2013) and Lactose intolerance.  HPI Patient presents to the clinic for ER follow up for chest wall muscle strain. For the details of today's visit, please refer to assessment and plan.   Review of Systems  Constitutional:  Negative for chills and fever.  Eyes:  Negative for blurred vision.  Respiratory:  Negative for shortness of breath.   Cardiovascular:  Negative for chest pain and palpitations.  Gastrointestinal:  Negative for abdominal pain.  Neurological:  Negative for dizziness and headaches.      Objective:     BP 129/81   Pulse (!) 55   Ht 5\' 4"  (1.626 m)   Wt 179 lb 1.3 oz (81.2 kg)   SpO2 97%   BMI 30.74 kg/m  BP Readings from Last 3 Encounters:  11/26/23 129/81  10/08/23 (!) 152/99  10/06/23 137/80      Physical Exam Vitals reviewed.  Constitutional:      General: He is not in acute distress.    Appearance: Normal appearance. He is not ill-appearing, toxic-appearing or diaphoretic.  HENT:     Head: Normocephalic.  Eyes:     General:        Right eye: No discharge.        Left eye: No discharge.     Conjunctiva/sclera: Conjunctivae normal.  Cardiovascular:     Rate and Rhythm: Normal rate.     Pulses: Normal pulses.     Heart sounds: Normal heart sounds.  Pulmonary:     Effort: Pulmonary effort is normal. No respiratory distress.     Breath sounds: Normal breath sounds.  Skin:    General: Skin is warm and dry.     Capillary Refill: Capillary refill takes less than 2 seconds.  Neurological:     Mental Status: He is alert.     Coordination: Coordination normal.     Gait: Gait normal.  Psychiatric:        Mood and Affect: Mood  normal.        Behavior: Behavior normal.      No results found for any visits on 11/26/23.  The ASCVD Risk score (Arnett DK, et al., 2019) failed to calculate for the following reasons:   The 2019 ASCVD risk score is only valid for ages 93 to 37    Assessment & Plan:  Muscle strain Assessment & Plan: Patient reports recovering well and no longer experiencing muscle strain. They have discontinued the use of muscle relaxants for chest muscle pain, as they are now pain-free Discussed rest and refrain from activities that place stress on the chest muscles. The hospital chart, including the discharge summary, was thoroughly reviewed Medications were thoroughly reviewed and reconciled with the patient.    Other orders -     Pantoprazole  Sodium; Take 1 tablet (40 mg total) by mouth daily before breakfast.  Dispense: 30 tablet; Refill: 3    Return in 9 months (on 08/25/2024), or if symptoms worsen or fail to improve, for Annual Physical.   Antonio Lek Amber Bail, FNP

## 2023-11-26 ENCOUNTER — Ambulatory Visit (INDEPENDENT_AMBULATORY_CARE_PROVIDER_SITE_OTHER): Payer: Medicaid Other | Admitting: Family Medicine

## 2023-11-26 VITALS — BP 129/81 | HR 55 | Ht 64.0 in | Wt 179.1 lb

## 2023-11-26 DIAGNOSIS — S29011D Strain of muscle and tendon of front wall of thorax, subsequent encounter: Secondary | ICD-10-CM | POA: Diagnosis not present

## 2023-11-26 DIAGNOSIS — T148XXA Other injury of unspecified body region, initial encounter: Secondary | ICD-10-CM

## 2023-11-26 MED ORDER — PANTOPRAZOLE SODIUM 40 MG PO TBEC: 40.0000 mg | DELAYED_RELEASE_TABLET | Freq: Every day | ORAL | 3 refills | Status: AC

## 2023-11-26 NOTE — Assessment & Plan Note (Signed)
 Patient reports recovering well and no longer experiencing muscle strain. They have discontinued the use of muscle relaxants for chest muscle pain, as they are now pain-free Discussed rest and refrain from activities that place stress on the chest muscles. The hospital chart, including the discharge summary, was thoroughly reviewed Medications were thoroughly reviewed and reconciled with the patient.

## 2024-06-11 ENCOUNTER — Ambulatory Visit
Admission: EM | Admit: 2024-06-11 | Discharge: 2024-06-11 | Disposition: A | Discharge status: Home / Self Care | Attending: Nurse Practitioner | Admitting: Nurse Practitioner

## 2024-06-11 DIAGNOSIS — R109 Unspecified abdominal pain: Secondary | ICD-10-CM | POA: Diagnosis not present

## 2024-06-11 DIAGNOSIS — R197 Diarrhea, unspecified: Secondary | ICD-10-CM

## 2024-06-11 LAB — POCT URINE DIPSTICK
Blood, UA: NEGATIVE
Glucose, UA: NEGATIVE mg/dL
Leukocytes, UA: NEGATIVE
Nitrite, UA: NEGATIVE
POC PROTEIN,UA: NEGATIVE
Spec Grav, UA: 1.025 (ref 1.010–1.025)
Urobilinogen, UA: 2 U/dL — AB
pH, UA: 6 (ref 5.0–8.0)

## 2024-06-11 MED ORDER — LIDOCAINE VISCOUS HCL 2 % MT SOLN
15.0000 mL | Freq: Once | OROMUCOSAL | Status: AC
  Administered 2024-06-11: 15 mL via OROMUCOSAL

## 2024-06-11 MED ORDER — KETOROLAC TROMETHAMINE 30 MG/ML IJ SOLN
30.0000 mg | Freq: Once | INTRAMUSCULAR | Status: AC
  Administered 2024-06-11: 30 mg via INTRAMUSCULAR

## 2024-06-11 MED ORDER — ALUM & MAG HYDROXIDE-SIMETH 200-200-20 MG/5ML PO SUSP
30.0000 mL | Freq: Once | ORAL | Status: AC
  Administered 2024-06-11: 30 mL via ORAL

## 2024-06-11 NOTE — ED Triage Notes (Signed)
 Pt reports he has been having abdominal pain, diarrhea, nausea, and low back pain x 1 week   Took tylenol which gave some relief

## 2024-06-11 NOTE — Discharge Instructions (Addendum)
 Lab work is pending.  You will be contacted if the pending test results are abnormal.  You will also access to your results via MyChart. You were given an injection of Toradol  30 mg.  Do not take any additional NSAIDs today to include Advil , ibuprofen , Motrin , Aleve, or naproxen.  You may continue Tylenol for breakthrough pain or discomfort. Take medication as prescribed. Increase fluids allow for plenty of rest.  Recommend Pedialyte or Gatorade if there is concern for dehydration. Dietary changes to help improve your diarrhea.  I provided information on foods that you can have to help with your diarrhea. Go to the emergency department immediately if you experience worsening diarrhea, abdominal pain, or develop new symptoms of fever, chills, nausea, or vomiting. Please call your gastroenterologist to schedule appointment for further evaluation.  Recommend an appointment within the next 3 to 5 days. Follow-up as needed.

## 2024-06-11 NOTE — ED Provider Notes (Signed)
 [facility]    CSN: [id number] Arrival date & time: 06/11/24  0830      History   Chief Complaint No chief complaint on file.   HPI Antonio [name] is a 23 y.o. male.   The history is provided by the patient.   Patient presents for complaints of diarrhea, left abdominal pain, and low back pain.  Patient states symptoms have been present for the past week.  He reports 2-3 episodes daily for the past week.  States that his diarrhea is like water.  Patient states that he is also experienced intermittent chills.  Patient with prior history of EGD.  Per review of the chart, patient with prior history of left upper quadrant abdominal pain and GERD.  States that he no longer takes Protonix  as his PCP told him not to become dependent on it.  Patient states that he has been drinking plenty of fluids and taking Tylenol for his symptoms with minimal relief.  Patient rates pain 5/10 at present.  He also complains of gas with the diarrhea.  Past Medical History:  Diagnosis Date   Environmental allergies 03/25/2013   Lactose intolerance     Patient Active Problem List   Diagnosis Date Noted   Muscle strain 10/06/2023   Heartburn 05/26/2023   Allergy 03/20/2023   Left upper quadrant abdominal pain 02/21/2023   Odynophagia 02/04/2023   Dysphagia 02/04/2023   Painful swallowing 01/30/2023   Lactose intolerance 03/21/2022   Encounter for well child check without abnormal findings 02/23/2018   Overweight 05/25/2013   Environmental allergies 03/25/2013    Past Surgical History:  Procedure Laterality Date   ESOPHAGOGASTRODUODENOSCOPY (EGD) WITH PROPOFOL  N/A 02/13/2023   Procedure: ESOPHAGOGASTRODUODENOSCOPY (EGD) WITH PROPOFOL ;  Surgeon: Shaaron Lamar HERO, MD;  Location: [facility];  Service: Endoscopy;  Laterality: N/A;  1:30 pm   MALONEY DILATION N/A 02/13/2023   Procedure: MALONEY DILATION;  Surgeon: Shaaron Lamar HERO, MD;  Location: [facility];  Service: Endoscopy;   Laterality: N/A;   TONSILLECTOMY         Home Medications    Prior to Admission medications   Medication Sig Start Date End Date Taking? Authorizing Provider  cetirizine  (ZYRTEC  ALLERGY) 10 MG tablet Take 1 tablet (10 mg total) by mouth daily. 02/21/23   Del Wilhelmena Lloyd Sola, FNP  fluticasone  (FLONASE ) 50 MCG/ACT nasal spray Place 2 sprays into both nostrils daily. 03/20/23   Del Orbe Polanco, Iliana, FNP  ibuprofen  (ADVIL ) 200 MG tablet Take 400 mg by mouth every 6 (six) hours as needed for moderate pain.    [provider]  pantoprazole  (PROTONIX ) 40 MG tablet Take 1 tablet (40 mg total) by mouth daily before breakfast. 11/26/23   Del Wilhelmena Lloyd, Iliana, FNP  tiZANidine  (ZANAFLEX ) 4 MG capsule Take 1 capsule (4 mg total) by mouth at bedtime as needed for muscle spasms. 10/06/23   Zarwolo, Gloria, FNP    Family History Family History  Problem Relation Age of Onset   Healthy Mother    Healthy Father    Healthy Sister    Hypertension Paternal Grandmother    Cancer Paternal Grandfather        prostate    Social History Social History   Tobacco Use   Smoking status: Never    Passive exposure: Yes   Smokeless tobacco: Never  Vaping Use   Vaping status: Some Days  Substance Use Topics   Alcohol use: No   Drug use: Yes  Types: Marijuana    Comment: last used 01/10/23     Allergies   Patient has no known allergies.   Review of Systems Review of Systems Per HPI  Physical Exam Triage Vital Signs ED Triage Vitals [06/11/24 0914]  Encounter Vitals Group     BP 133/86     Girls Systolic BP Percentile      Girls Diastolic BP Percentile      Boys Systolic BP Percentile      Boys Diastolic BP Percentile      Pulse Rate 73     Resp 18     Temp 98.2 F (36.8 C)     Temp Source Oral     SpO2 96 %     Weight      Height      Head Circumference      Peak Flow      Pain Score 0     Pain Loc      Pain Education      Exclude from Growth Chart    No  data found.  Updated Vital Signs BP 133/86 (BP Location: Right Arm)   Pulse 73   Temp 98.2 F (36.8 C) (Oral)   Resp 18   SpO2 96%   Visual Acuity Right Eye Distance:   Left Eye Distance:   Bilateral Distance:    Right Eye Near:   Left Eye Near:    Bilateral Near:     Physical Exam Vitals and nursing note reviewed.  Constitutional:      General: He is not in acute distress.    Appearance: Normal appearance.  HENT:     Head: Normocephalic.  Eyes:     Extraocular Movements: Extraocular movements intact.     Pupils: Pupils are equal, round, and reactive to light.  Cardiovascular:     Rate and Rhythm: Normal rate and regular rhythm.     Pulses: Normal pulses.     Heart sounds: Normal heart sounds.  Pulmonary:     Effort: Pulmonary effort is normal.     Breath sounds: Normal breath sounds.  Abdominal:     General: Bowel sounds are normal.     Palpations: Abdomen is soft.     Tenderness: There is abdominal tenderness. There is no right CVA tenderness or left CVA tenderness.  Musculoskeletal:     Cervical back: Normal range of motion.  Skin:    General: Skin is warm and dry.  Neurological:     General: No focal deficit present.     Mental Status: He is alert and oriented to person, place, and time.  Psychiatric:        Mood and Affect: Mood normal.        Behavior: Behavior normal.      UC Treatments / Results  Labs (all labs ordered are listed, but only abnormal results are displayed) Labs Reviewed  POCT URINE DIPSTICK - Abnormal; Notable for the following components:      Result Value   Color, UA brown (*)    Bilirubin, UA small (*)    Ketones, POC UA small (15) (*)    Urobilinogen, UA 2.0 (*)    All other components within normal limits  CBC WITH DIFFERENTIAL/PLATELET  BASIC METABOLIC PANEL WITH GFR  LIPASE    EKG   Radiology No results found.  Procedures Procedures (including critical care time)  Medications Ordered in UC Medications  alum  & mag hydroxide-simeth (MAALOX/MYLANTA) 200-200-20 MG/5ML suspension 30 mL (  30 mLs Oral Given 06/11/24 0935)  lidocaine  (XYLOCAINE ) 2 % viscous mouth solution 15 mL (15 mLs Mouth/Throat Given 06/11/24 0935)  ketorolac  (TORADOL ) 30 MG/ML injection 30 mg (30 mg Intramuscular Given 06/11/24 1003)    Initial Impression / Assessment and Plan / UC Course  I have reviewed the triage vital signs and the nursing notes.  Pertinent labs & imaging results that were available during my care of the patient were reviewed by me and considered in my medical decision making (see chart for details).  Patient presents for complaints of left-sided abdominal pain and diarrhea for the past week.  GI cocktail was administered with minimal relief of the patient's symptoms.  Toradol  30 mg IM also administered for pain.  On exam, patient does have some left upper and lower quadrant tenderness.  CBC, BMP, and lipase ordered.  Will start patient on Imodium  82 mg for diarrhea.  Supportive care recommendations were provided discussed with the patient to include fluids, rest, continuing over-the-counter analgesics, and dietary modifications to help control diarrhea.  Patient was given strict ER follow-up precautions.  Patient advised to follow-up with GI for further evaluation.  Patient was in agreement with this plan of care and verbalizes understanding.  All questions were answered.  Patient stable for discharge.  Note provided for work and school.  Final Clinical Impressions(s) / UC Diagnoses   Final diagnoses:  Abdominal pain, unspecified abdominal location  Diarrhea, unspecified type     Discharge Instructions      Lab work is pending.  You will be contacted if the pending test results are abnormal.  You will also access to your results via MyChart. You were given an injection of Toradol  30 mg.  Do not take any additional NSAIDs today to include Advil , ibuprofen , Motrin , Aleve, or naproxen.  You may continue Tylenol for  breakthrough pain or discomfort. Take medication as prescribed. Increase fluids allow for plenty of rest.  Recommend Pedialyte or Gatorade if there is concern for dehydration. Dietary changes to help improve your diarrhea.  I provided information on foods that you can have to help with your diarrhea. Go to the emergency department immediately if you experience worsening diarrhea, abdominal pain, or develop new symptoms of fever, chills, nausea, or vomiting. Please call your gastroenterologist to schedule appointment for further evaluation.  Recommend an appointment within the next 3 to 5 days. Follow-up as needed.     ED Prescriptions   None    PDMP not reviewed this encounter.   Gilmer Etta PARAS, NP 06/11/24 1007

## 2024-06-12 LAB — BASIC METABOLIC PANEL WITH GFR
BUN/Creatinine Ratio: 13 (ref 9–20)
BUN: 14 mg/dL (ref 6–20)
CO2: 19 mmol/L — ABNORMAL LOW (ref 20–29)
Calcium: 10.2 mg/dL (ref 8.7–10.2)
Chloride: 102 mmol/L (ref 96–106)
Creatinine, Ser: 1.11 mg/dL (ref 0.76–1.27)
Glucose: 86 mg/dL (ref 70–99)
Potassium: 4.4 mmol/L (ref 3.5–5.2)
Sodium: 141 mmol/L (ref 134–144)
eGFR: 97 mL/min/1.73 (ref >59)

## 2024-06-12 LAB — CBC WITH DIFFERENTIAL/PLATELET
Basophils Absolute: 0 x10E3/uL (ref 0.0–0.2)
Basos: 0 %
EOS (ABSOLUTE): 0 x10E3/uL (ref 0.0–0.4)
Eos: 0 %
Hematocrit: 55.2 % — ABNORMAL HIGH (ref 37.5–51.0)
Hemoglobin: 17.9 g/dL — ABNORMAL HIGH (ref 13.0–17.7)
Immature Grans (Abs): 0 x10E3/uL (ref 0.0–0.1)
Immature Granulocytes: 0 %
Lymphocytes Absolute: 1.4 x10E3/uL (ref 0.7–3.1)
Lymphs: 31 %
MCH: 29 pg (ref 26.6–33.0)
MCHC: 32.4 g/dL (ref 31.5–35.7)
MCV: 90 fL (ref 79–97)
Monocytes Absolute: 0.5 x10E3/uL (ref 0.1–0.9)
Monocytes: 11 %
Neutrophils Absolute: 2.6 x10E3/uL (ref 1.4–7.0)
Neutrophils: 58 %
Platelets: 226 x10E3/uL (ref 150–450)
RBC: 6.17 x10E6/uL — ABNORMAL HIGH (ref 4.14–5.80)
RDW: 13.1 % (ref 11.6–15.4)
WBC: 4.6 x10E3/uL (ref 3.4–10.8)

## 2024-06-12 LAB — LIPASE: Lipase: 21 U/L (ref 13–78)

## 2024-06-13 ENCOUNTER — Telehealth: Payer: Self-pay | Admitting: Emergency Medicine

## 2024-06-13 NOTE — Telephone Encounter (Signed)
 Patient called and wanted to review blood work from 8/1.  Antonio Seeds, NP reviewed blood work and advised blood work did not show any acute findings regarding his abdominal symptoms. I would like him to follow-up with his PCP for his abnormal CBC results, recommend having his PCP repeat this bloodwork for further evaluation.   Patient states he continues to have ABD pain and will make a follow up appointment with his doctor.

## 2024-06-14 ENCOUNTER — Other Ambulatory Visit: Payer: Self-pay

## 2024-06-14 ENCOUNTER — Emergency Department (HOSPITAL_COMMUNITY)

## 2024-06-14 ENCOUNTER — Emergency Department (HOSPITAL_COMMUNITY)
Admission: EM | Admit: 2024-06-14 | Discharge: 2024-06-15 | Disposition: A | Discharge status: Home / Self Care | Attending: Student | Admitting: Student

## 2024-06-14 ENCOUNTER — Encounter (HOSPITAL_COMMUNITY): Payer: Self-pay | Admitting: Emergency Medicine

## 2024-06-14 ENCOUNTER — Ambulatory Visit (HOSPITAL_COMMUNITY): Payer: Self-pay

## 2024-06-14 ENCOUNTER — Telehealth: Payer: Self-pay | Admitting: Internal Medicine

## 2024-06-14 DIAGNOSIS — R1013 Epigastric pain: Secondary | ICD-10-CM | POA: Insufficient documentation

## 2024-06-14 DIAGNOSIS — R197 Diarrhea, unspecified: Secondary | ICD-10-CM | POA: Diagnosis not present

## 2024-06-14 DIAGNOSIS — R110 Nausea: Secondary | ICD-10-CM | POA: Diagnosis not present

## 2024-06-14 DIAGNOSIS — R109 Unspecified abdominal pain: Secondary | ICD-10-CM

## 2024-06-14 LAB — CBC
HCT: 47.3 % (ref 39.0–52.0)
Hemoglobin: 16.6 g/dL (ref 13.0–17.0)
MCH: 29.7 pg (ref 26.0–34.0)
MCHC: 35.1 g/dL (ref 30.0–36.0)
MCV: 84.8 fL (ref 80.0–100.0)
Platelets: 230 K/uL (ref 150–400)
RBC: 5.58 MIL/uL (ref 4.22–5.81)
RDW: 12.2 % (ref 11.5–15.5)
WBC: 4.9 K/uL (ref 4.0–10.5)
nRBC: 0 % (ref 0.0–0.2)

## 2024-06-14 LAB — COMPREHENSIVE METABOLIC PANEL WITH GFR
ALT: 28 U/L (ref 0–44)
AST: 23 U/L (ref 15–41)
Albumin: 4.4 g/dL (ref 3.5–5.0)
Alkaline Phosphatase: 63 U/L (ref 38–126)
Anion gap: 12 (ref 5–15)
BUN: 12 mg/dL (ref 6–20)
CO2: 25 mmol/L (ref 22–32)
Calcium: 9.4 mg/dL (ref 8.9–10.3)
Chloride: 101 mmol/L (ref 98–111)
Creatinine, Ser: 1 mg/dL (ref 0.61–1.24)
GFR, Estimated: >60 mL/min (ref >60)
Glucose, Bld: 88 mg/dL (ref 70–99)
Potassium: 3.5 mmol/L (ref 3.5–5.1)
Sodium: 138 mmol/L (ref 135–145)
Total Bilirubin: 0.9 mg/dL (ref 0.0–1.2)
Total Protein: 7.7 g/dL (ref 6.5–8.1)

## 2024-06-14 LAB — LIPASE, BLOOD: Lipase: 39 U/L (ref 11–51)

## 2024-06-14 MED ORDER — DICYCLOMINE HCL 10 MG/ML IM SOLN
20.0000 mg | Freq: Once | INTRAMUSCULAR | Status: AC
  Administered 2024-06-14: 20 mg via INTRAMUSCULAR
  Filled 2024-06-14: qty 2

## 2024-06-14 MED ORDER — LIDOCAINE VISCOUS HCL 2 % MT SOLN
15.0000 mL | Freq: Once | OROMUCOSAL | Status: AC
  Administered 2024-06-15: 15 mL via ORAL
  Filled 2024-06-14: qty 15

## 2024-06-14 MED ORDER — ALUM & MAG HYDROXIDE-SIMETH 200-200-20 MG/5ML PO SUSP
30.0000 mL | Freq: Once | ORAL | Status: AC
  Administered 2024-06-15: 30 mL via ORAL
  Filled 2024-06-14: qty 30

## 2024-06-14 NOTE — Telephone Encounter (Signed)
 Patient left a message to schedule an appointment.  I called him but the call couldn't go through as dialed.

## 2024-06-14 NOTE — ED Provider Notes (Signed)
 [place] [facility]ERGENCY DEPARTMENT AT [facility] Provider Note  CSN: [id number] Arrival date & time: 06/14/24 2107  Chief Complaint(s) Abdominal Pain  HPI Antonio [name] is a 23 y.o. male with PMH Schatzki's ring, GERD who presents emerged part for evaluation of abdominal pain, nausea and diarrhea.  States that diarrhea is been present for the last 2 weeks but is starting to improve now that patient started Imodium .  Diarrhea has been nonbloody.  Endorsing persistent nausea over this time.  With some decreased p.o. intake.  Endorses primarily epigastric abdominal pain but denies headache, fever, chest pain, shortness of breath or other systemic symptoms.   Past Medical History Past Medical History:  Diagnosis Date   Environmental allergies 03/25/2013   Lactose intolerance    Patient Active Problem List   Diagnosis Date Noted   Muscle strain 10/06/2023   Heartburn 05/26/2023   Allergy 03/20/2023   Left upper quadrant abdominal pain 02/21/2023   Odynophagia 02/04/2023   Dysphagia 02/04/2023   Painful swallowing 01/30/2023   Lactose intolerance 03/21/2022   Encounter for well child check without abnormal findings 02/23/2018   Overweight 05/25/2013   Environmental allergies 03/25/2013   Home Medication(s) Prior to Admission medications   Medication Sig Start Date End Date Taking? Authorizing Provider  dicyclomine  (BENTYL ) 20 MG tablet Take 1 tablet (20 mg total) by mouth 2 (two) times daily. 06/15/24  Yes Chani Ghanem, MD  ondansetron  (ZOFRAN -ODT) 4 MG disintegrating tablet Take 1 tablet (4 mg total) by mouth every 8 (eight) hours as needed for nausea or vomiting. 06/15/24  Yes Tysha Grismore, MD  cetirizine  (ZYRTEC  ALLERGY) 10 MG tablet Take 1 tablet (10 mg total) by mouth daily. 02/21/23   Del Wilhelmena Lloyd Sola, FNP  fluticasone  (FLONASE ) 50 MCG/ACT nasal spray Place 2 sprays into both nostrils daily. 03/20/23   Del Wilhelmena Lloyd Sola, FNP  ibuprofen  (ADVIL ) 200 MG  tablet Take 400 mg by mouth every 6 (six) hours as needed for moderate pain.    [provider]  pantoprazole  (PROTONIX ) 40 MG tablet Take 1 tablet (40 mg total) by mouth daily before breakfast. 11/26/23   Del Wilhelmena Lloyd, Iliana, FNP  tiZANidine  (ZANAFLEX ) 4 MG capsule Take 1 capsule (4 mg total) by mouth at bedtime as needed for muscle spasms. 10/06/23   Zarwolo, Gloria, FNP                                                                                                                                    Past Surgical History Past Surgical History:  Procedure Laterality Date   ESOPHAGOGASTRODUODENOSCOPY (EGD) WITH PROPOFOL  N/A 02/13/2023   Procedure: ESOPHAGOGASTRODUODENOSCOPY (EGD) WITH PROPOFOL ;  Surgeon: Shaaron Lamar HERO, MD;  [facility];  Service: Endoscopy;  Laterality: N/A;  1:30 pm   MALONEY DILATION N/A 02/13/2023   Procedure: MALONEY DILATION;  Surgeon: Shaaron Lamar HERO, MD;  Location: [facility];  Service:  Endoscopy;  Laterality: N/A;   TONSILLECTOMY     Family History Family History  Problem Relation Age of Onset   Healthy Mother    Healthy Father    Healthy Sister    Hypertension Paternal Grandmother    Cancer Paternal Grandfather        prostate    Social History Social History   Tobacco Use   Smoking status: Never    Passive exposure: Yes   Smokeless tobacco: Never  Vaping Use   Vaping status: Some Days  Substance Use Topics   Alcohol use: No   Drug use: Yes    Types: Marijuana    Comment: last used 01/10/23   Allergies Patient has no known allergies.  Review of Systems Review of Systems  Gastrointestinal:  Positive for abdominal pain, diarrhea and nausea.    Physical Exam Vital Signs  I have reviewed the triage vital signs BP (!) 133/93   Pulse (!) 58   Temp 98.7 F (37.1 C)   Resp 17   Ht 5' 4 (1.626 m)   Wt 79.4 kg   SpO2 97%   BMI 30.04 kg/m   Physical Exam Constitutional:      General: He is not in acute distress.     Appearance: Normal appearance.  HENT:     Head: Normocephalic and atraumatic.     Nose: No congestion or rhinorrhea.  Eyes:     General:        Right eye: No discharge.        Left eye: No discharge.     Extraocular Movements: Extraocular movements intact.     Pupils: Pupils are equal, round, and reactive to light.  Cardiovascular:     Rate and Rhythm: Normal rate and regular rhythm.     Heart sounds: No murmur heard. Pulmonary:     Effort: No respiratory distress.     Breath sounds: No wheezing or rales.  Abdominal:     General: There is no distension.     Tenderness: There is abdominal tenderness in the epigastric area.  Musculoskeletal:        General: Normal range of motion.     Cervical back: Normal range of motion.  Skin:    General: Skin is warm and dry.  Neurological:     General: No focal deficit present.     Mental Status: He is alert.     ED Results and Treatments Labs (all labs ordered are listed, but only abnormal results are displayed) Labs Reviewed  URINALYSIS, ROUTINE W REFLEX MICROSCOPIC - Abnormal; Notable for the following components:      Result Value   Ketones, ur 5 (*)    Protein, ur 30 (*)    All other components within normal limits  RESP PANEL BY RT-PCR (RSV, FLU A&B, COVID)  RVPGX2  LIPASE, BLOOD  COMPREHENSIVE METABOLIC PANEL WITH GFR  CBC  Radiology CT ABDOMEN PELVIS W CONTRAST Result Date: 06/15/2024 EXAM: CT ABDOMEN AND PELVIS WITH CONTRAST 06/15/2024 12:31:21 AM TECHNIQUE: CT of the abdomen and pelvis was performed with the administration of intravenous contrast. Multiplanar reformatted images are provided for review. Automated exposure control, iterative reconstruction, and/or weight based adjustment of the mA/kV was utilized to reduce the radiation dose to as low as reasonably achievable. COMPARISON: 02/11/2023 CLINICAL  HISTORY: Epigastric pain. Pt c/o intermittent abdominal pain with N/V/D x 1 week. FINDINGS: LOWER CHEST: No acute abnormality. LIVER: The liver is unremarkable. GALLBLADDER AND BILE DUCTS: Gallbladder is unremarkable. No biliary ductal dilatation. SPLEEN: No acute abnormality. PANCREAS: No acute abnormality. ADRENAL GLANDS: No acute abnormality. KIDNEYS, URETERS AND BLADDER: No stones in the kidneys or ureters. No hydronephrosis. No perinephric or periureteral stranding. Urinary bladder is unremarkable. GI AND BOWEL: Stomach demonstrates no acute abnormality. There is no bowel obstruction. No bowel wall thickening. PERITONEUM AND RETROPERITONEUM: No ascites. No free air. VASCULATURE: Aorta is normal in caliber. LYMPH NODES: No lymphadenopathy. REPRODUCTIVE ORGANS: No acute abnormality. BONES AND SOFT TISSUES: No acute osseous abnormality. No focal soft tissue abnormality. IMPRESSION: 1. No acute findings in the abdomen or pelvis. Electronically signed by: Norman Gatlin MD 06/15/2024 01:00 AM EDT RP Workstation: [id number]    Pertinent labs & imaging results that were available during my care of the patient were reviewed by me and considered in my medical decision making (see MDM for details).  Medications Ordered in ED Medications  dicyclomine  (BENTYL ) injection 20 mg (20 mg Intramuscular Given 06/14/24 2359)  alum & mag hydroxide-simeth (MAALOX/MYLANTA) 200-200-20 MG/5ML suspension 30 mL (30 mLs Oral Given 06/15/24 0002)    And  lidocaine  (XYLOCAINE ) 2 % viscous mouth solution 15 mL (15 mLs Oral Given 06/15/24 0002)  iohexol  (OMNIPAQUE ) 300 MG/ML solution 100 mL (100 mLs Intravenous Contrast Given 06/15/24 0019)                                                                                                                                     Procedures Procedures  (including critical care time)  Medical Decision Making / ED Course   This patient presents to the ED for concern of abdominal pain,  diarrhea, nausea, this involves an extensive number of treatment options, and is a complaint that carries with it a high risk of complications and morbidity.  The differential diagnosis includes GERD/gastritis, peptic ulcer disease, pancreatitis, gastroparesis, pneumonia, pleurisy, pericarditis, gastroenteritis  MDM: Patient seen emerged from for evaluation of abdominal pain, vomiting diarrhea.  Physical exam with some mild epigastric tenderness to palpation but is otherwise unremarkable.  Laboratory evaluation unremarkable.  CT abdomen pelvis unremarkable.  Patient pain controlled with GI cocktail, Bentyl .  On reevaluation, patient feels symptoms have significant lyimproved.  At this time he does not meet inpatient criteria and will be discharged with outpatient follow-up.  Return precautions given which she voiced understanding.   Additional history  obtained: -Additional history obtained from partner -External records from outside source obtained and reviewed including: Chart review including previous notes, labs, imaging, consultation notes   Lab Tests: -I ordered, reviewed, and interpreted labs.   The pertinent results include:   Labs Reviewed  URINALYSIS, ROUTINE W REFLEX MICROSCOPIC - Abnormal; Notable for the following components:      Result Value   Ketones, ur 5 (*)    Protein, ur 30 (*)    All other components within normal limits  RESP PANEL BY RT-PCR (RSV, FLU A&B, COVID)  RVPGX2  LIPASE, BLOOD  COMPREHENSIVE METABOLIC PANEL WITH GFR  CBC     Imaging Studies ordered: I ordered imaging studies including CTAP I independently visualized and interpreted imaging. I agree with the radiologist interpretation   Medicines ordered and prescription drug management: Meds ordered this encounter  Medications   dicyclomine  (BENTYL ) injection 20 mg   AND Linked Order Group    alum & mag hydroxide-simeth (MAALOX/MYLANTA) 200-200-20 MG/5ML suspension 30 mL    lidocaine  (XYLOCAINE ) 2 %  viscous mouth solution 15 mL   iohexol  (OMNIPAQUE ) 300 MG/ML solution 100 mL   ondansetron  (ZOFRAN -ODT) 4 MG disintegrating tablet    Sig: Take 1 tablet (4 mg total) by mouth every 8 (eight) hours as needed for nausea or vomiting.    Dispense:  20 tablet    Refill:  0   dicyclomine  (BENTYL ) 20 MG tablet    Sig: Take 1 tablet (20 mg total) by mouth 2 (two) times daily.    Dispense:  20 tablet    Refill:  0    -I have reviewed the patients home medicines and have made adjustments as needed  Critical interventions none  Social Determinants of Health:  Factors impacting patients care include: none   Reevaluation: After the interventions noted above, I reevaluated the patient and found that they have :improved  Co morbidities that complicate the patient evaluation  Past Medical History:  Diagnosis Date   Environmental allergies 03/25/2013   Lactose intolerance       Dispostion: I considered admission for this patient, but at this time he does not meet inpatient criteria for admission will be discharged outpatient follow-up     Final Clinical Impression(s) / ED Diagnoses Final diagnoses:  Abdominal pain, unspecified abdominal location     @PCDICTATION @    Albertina Dixon, MD 06/15/24 0110

## 2024-06-14 NOTE — ED Triage Notes (Signed)
 Pt c/o intermittent abdominal pain with N/V/D x 1 week.

## 2024-06-15 ENCOUNTER — Ambulatory Visit

## 2024-06-15 ENCOUNTER — Encounter: Payer: Self-pay | Admitting: Internal Medicine

## 2024-06-15 ENCOUNTER — Ambulatory Visit: Admitting: Internal Medicine

## 2024-06-15 DIAGNOSIS — R1013 Epigastric pain: Secondary | ICD-10-CM | POA: Diagnosis not present

## 2024-06-15 LAB — URINALYSIS, ROUTINE W REFLEX MICROSCOPIC
Bacteria, UA: NONE SEEN
Bilirubin Urine: NEGATIVE
Glucose, UA: NEGATIVE mg/dL
Hgb urine dipstick: NEGATIVE
Ketones, ur: 5 mg/dL — AB
Leukocytes,Ua: NEGATIVE
Nitrite: NEGATIVE
Protein, ur: 30 mg/dL — AB
Specific Gravity, Urine: 1.023 (ref 1.005–1.030)
pH: 5 (ref 5.0–8.0)

## 2024-06-15 LAB — RESP PANEL BY RT-PCR (RSV, FLU A&B, COVID)  RVPGX2
Influenza A by PCR: NEGATIVE
Influenza B by PCR: NEGATIVE
Resp Syncytial Virus by PCR: NEGATIVE
SARS Coronavirus 2 by RT PCR: NEGATIVE

## 2024-06-15 MED ORDER — IOHEXOL 300 MG/ML  SOLN
100.0000 mL | Freq: Once | INTRAMUSCULAR | Status: AC | PRN
  Administered 2024-06-15: 100 mL via INTRAVENOUS

## 2024-06-15 MED ORDER — DICYCLOMINE HCL 20 MG PO TABS: 20.0000 mg | ORAL_TABLET | Freq: Two times a day (BID) | ORAL | 0 refills | Status: AC

## 2024-06-15 MED ORDER — ONDANSETRON 4 MG PO TBDP: 4.0000 mg | ORAL_TABLET | Freq: Three times a day (TID) | ORAL | 0 refills | Status: AC | PRN

## 2024-06-15 NOTE — ED Notes (Signed)
 Patient transported to CT

## 2024-07-01 ENCOUNTER — Ambulatory Visit: Payer: Self-pay | Admitting: Family Medicine

## 2024-07-01 ENCOUNTER — Encounter: Payer: Self-pay | Admitting: Family Medicine

## 2024-07-01 VITALS — BP 128/81 | HR 66 | Ht 64.0 in | Wt 168.0 lb

## 2024-07-01 DIAGNOSIS — B349 Viral infection, unspecified: Secondary | ICD-10-CM

## 2024-07-01 DIAGNOSIS — M545 Low back pain, unspecified: Secondary | ICD-10-CM

## 2024-07-01 DIAGNOSIS — T148XXA Other injury of unspecified body region, initial encounter: Secondary | ICD-10-CM

## 2024-07-01 MED ORDER — TIZANIDINE HCL 4 MG PO CAPS: 4.0000 mg | ORAL_CAPSULE | Freq: Every evening | ORAL | 1 refills | Status: AC | PRN

## 2024-07-01 MED ORDER — IBUPROFEN 200 MG PO TABS: 400.0000 mg | ORAL_TABLET | Freq: Four times a day (QID) | ORAL | 1 refills | Status: AC | PRN

## 2024-07-01 NOTE — Progress Notes (Signed)
 Established Patient Office Visit  Subjective:  Patient ID: Antonio [name], male    DOB: 29-Jun-2001  Age: 23 y.o. MRN: [id number]  CC:  Chief Complaint  Patient presents with   Abdominal Pain    C/o nausea, poss fevers (hasn't checked temp), chills   Back Pain    LBP; comes and goes, worse at night    HPI Antonio [name] is a 23 y.o. male with past medical history of  muscle strain, heartburn presents for f/u of  chronic medical conditions.  Viral illness: The patient was evaluated in the emergency department two weeks ago for similar symptoms. At that time, a CT scan of the abdomen was negative, as were CMP, respiratory panel, and urinalysis results. He was prescribed dicyclomine  and Zofran , which provided minimal relief. Currently, he reports nausea, vomiting, and cold chills for the past two days.  Muscle strain: The patient also reports low back pain that comes and goes. 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 He denies red flag symptoms such as numbness, tingling, weakness, bowel/bladder incontinence, or saddle anesthesia.    Past Medical History:  Diagnosis Date   Environmental allergies 03/25/2013   Lactose intolerance     Past Surgical History:  Procedure Laterality Date   ESOPHAGOGASTRODUODENOSCOPY (EGD) WITH PROPOFOL  N/A 02/13/2023   Procedure: ESOPHAGOGASTRODUODENOSCOPY (EGD) WITH PROPOFOL ;  Surgeon: Shaaron Lamar HERO, MD;  [facility];  Service: Endoscopy;  Laterality: N/A;  1:30 pm   MALONEY DILATION N/A 02/13/2023   Procedure: MALONEY DILATION;  Surgeon: Shaaron Lamar HERO, MD;  [facility]: [facility];  Service: Endoscopy;  Laterality: N/A;   TONSILLECTOMY      Family History  Problem Relation Age of Onset   Healthy Mother    Healthy Father    Healthy Sister    Hypertension Paternal Grandmother    Cancer Paternal Grandfather        prostate    Social History   Socioeconomic History    Marital status: Single    Spouse name: Not on file   Number of children: Not on file   Years of education: Not on file   Highest education level: GED or equivalent  Occupational History   Not on file  Tobacco Use   Smoking status: Never    Passive exposure: Yes   Smokeless tobacco: Never  Vaping Use   Vaping status: Some Days  Substance and Sexual Activity   Alcohol use: No   Drug use: Yes    Types: Marijuana    Comment: last used 01/10/23   Sexual activity: Not on file  Other Topics Concern   Not on file  Social History Narrative   Lives with parents and siblings      Graduated from high school 2022, currently in [employer]    Social Drivers of Health   Financial Resource Strain: Medium Risk (11/26/2023)   Overall Financial Resource Strain (CARDIA)    Difficulty of Paying Living Expenses: Somewhat hard  Food Insecurity: Food Insecurity Present (11/26/2023)   Hunger Vital Sign    Worried About Running Out of Food in the Last Year: Sometimes true    Ran Out of Food in the Last Year: Sometimes true  Transportation Needs: Unmet Transportation Needs (11/26/2023)   PRAPARE - Transportation    Lack of Transportation (Medical): Yes    Lack of Transportation (Non-Medical): Yes  Physical Activity: Sufficiently Active (10/06/2023)   Exercise Vital Sign  Days of Exercise per Week: 7 days    Minutes of Exercise per Session: 120 min  Stress: Stress Concern Present (10/06/2023)   [employer] of Occupational Health - Occupational Stress Questionnaire    Feeling of Stress : Rather much  Social Connections: Unknown (11/26/2023)   Social Connection and Isolation Panel    Frequency of Communication with Friends and Family: More than three times a week    Frequency of Social Gatherings with Friends and Family: Twice a week    Attends Religious Services: More than 4 times per year    Active Member of [employer] or Organizations: Yes    Attends Banker Meetings: 1 to 4  times per year    Marital Status: Patient declined  Catering manager Violence: Not on file    Outpatient Medications Prior to Visit  Medication Sig Dispense Refill   cetirizine  (ZYRTEC  ALLERGY) 10 MG tablet Take 1 tablet (10 mg total) by mouth daily. 30 tablet 0   fluticasone  (FLONASE ) 50 MCG/ACT nasal spray Place 2 sprays into both nostrils daily. 16 g 6   ondansetron  (ZOFRAN -ODT) 4 MG disintegrating tablet Take 1 tablet (4 mg total) by mouth every 8 (eight) hours as needed for nausea or vomiting. 20 tablet 0   ibuprofen  (ADVIL ) 200 MG tablet Take 400 mg by mouth every 6 (six) hours as needed for moderate pain.     tiZANidine  (ZANAFLEX ) 4 MG capsule Take 1 capsule (4 mg total) by mouth at bedtime as needed for muscle spasms. 30 capsule 0   dicyclomine  (BENTYL ) 20 MG tablet Take 1 tablet (20 mg total) by mouth 2 (two) times daily. (Patient not taking: Reported on 07/01/2024) 20 tablet 0   pantoprazole  (PROTONIX ) 40 MG tablet Take 1 tablet (40 mg total) by mouth daily before breakfast. (Patient not taking: Reported on 07/01/2024) 30 tablet 3   No facility-administered medications prior to visit.    No Known Allergies  ROS Review of Systems  Constitutional:  Negative for fatigue and fever.  Eyes:  Negative for visual disturbance.  Respiratory:  Negative for chest tightness and shortness of breath.   Cardiovascular:  Negative for chest pain and palpitations.  Gastrointestinal:  Positive for nausea and vomiting.  Musculoskeletal:  Positive for back pain.  Neurological:  Negative for dizziness and headaches.      Objective:    Physical Exam HENT:     Head: Normocephalic.     Right Ear: External ear normal.     Left Ear: External ear normal.     Nose: No congestion or rhinorrhea.     Mouth/Throat:     Mouth: Mucous membranes are moist.  Cardiovascular:     Rate and Rhythm: Regular rhythm.     Heart sounds: No murmur heard. Pulmonary:     Effort: No respiratory distress.      Breath sounds: Normal breath sounds.  Neurological:     Mental Status: He is alert.     BP 128/81   Pulse 66   Ht 5' 4 (1.626 m)   Wt 168 lb (76.2 kg)   SpO2 94%   BMI 28.84 kg/m  Wt Readings from Last 3 Encounters:  07/01/24 168 lb (76.2 kg)  06/14/24 175 lb (79.4 kg)  11/26/23 179 lb 1.3 oz (81.2 kg)    Lab Results  Component Value Date   TSH 0.991 01/30/2023   Lab Results  Component Value Date   WBC 4.9 06/14/2024   HGB 16.6 06/14/2024  HCT 47.3 06/14/2024   MCV 84.8 06/14/2024   PLT 230 06/14/2024   Lab Results  Component Value Date   NA 138 06/14/2024   K 3.5 06/14/2024   CO2 25 06/14/2024   GLUCOSE 88 06/14/2024   BUN 12 06/14/2024   CREATININE 1.00 06/14/2024   BILITOT 0.9 06/14/2024   ALKPHOS 63 06/14/2024   AST 23 06/14/2024   ALT 28 06/14/2024   PROT 7.7 06/14/2024   ALBUMIN 4.4 06/14/2024   CALCIUM 9.4 06/14/2024   ANIONGAP 12 06/14/2024   EGFR 97 06/11/2024   Lab Results  Component Value Date   CHOL 148 01/30/2023   Lab Results  Component Value Date   HDL 31 (L) 01/30/2023   Lab Results  Component Value Date   LDLCALC 103 (H) 01/30/2023   Lab Results  Component Value Date   TRIG 71 01/30/2023   Lab Results  Component Value Date   CHOLHDL 4.8 01/30/2023   Lab Results  Component Value Date   HGBA1C 5.4 01/30/2023      Assessment & Plan:  Viral illness Assessment & Plan: -Encouraged to take OTC Pepto-Bismol for stomach upset. -Continue Zofran  as needed for nausea. -If fever develops, you may take Tylenol (acetaminophen).  Lifestyle & Supportive Care: -Increase fluid intake (water, clear fluids, electrolyte drinks). -Allow for plenty of rest to help the body recover. -Viral illnesses are usually self-limiting. -Monitor for worsening symptoms such as persistent high fever, severe dehydration, chest pain, or shortness of breath -- seek medical care if these occur.  Orders: -     COVID-19, Flu A+B and RSV  Muscle  strain Assessment & Plan: -Start Zanaflex  (tizanidine ) 4 mg by mouth at bedtime. -Take Ibuprofen  400 mg every 6 hours as needed for pain and inflammation (with food to protect stomach).  Non-Pharmacological Interventions: -Apply ice packs to the affected area during the first 48 hours, then switch to heat as needed. -Perform gentle stretching exercises as tolerated. -Avoid heavy lifting or strenuous activities until symptoms improve. -Use proper posture and body mechanics when sitting, standing, or lifting.  Orders: -     tiZANidine  HCl; Take 1 capsule (4 mg total) by mouth at bedtime as needed for muscle spasms.  Dispense: 30 capsule; Refill: 1 -     Ibuprofen ; Take 2 tablets (400 mg total) by mouth every 6 (six) hours as needed for moderate pain (pain score 4-6).  Dispense: 30 tablet; Refill: 1  Note: This chart has been completed using Engineer, civil (consulting) software, and while attempts have been made to ensure accuracy, certain words and phrases may not be transcribed as intended.    Follow-up: No follow-ups on file.   Waldo Damian, FNP

## 2024-07-01 NOTE — Patient Instructions (Addendum)
 I appreciate the opportunity to provide care to you today!  Viral Illness -May take OTC Pepto-Bismol for stomach upset. -Continue Zofran  as needed for nausea. -If fever develops, you may take Tylenol (acetaminophen).  Lifestyle & Supportive Care: -Increase fluid intake (water, clear fluids, electrolyte drinks). -Allow for plenty of rest to help the body recover. -Viral illnesses are usually self-limiting. -Monitor for worsening symptoms such as persistent high fever, severe dehydration, chest pain, or shortness of breath -- seek medical care if these occur.  Muscle Strain -Start Zanaflex  (tizanidine ) 4 mg by mouth at bedtime. -Take Ibuprofen  400 mg every 6 hours as needed for pain and inflammation (with food to protect stomach).  Non-Pharmacological Interventions: -Apply ice packs to the affected area during the first 48 hours, then switch to heat as needed. -Perform gentle stretching exercises as tolerated. -Avoid heavy lifting or strenuous activities until symptoms improve. -Use proper posture and body mechanics when sitting, standing, or lifting.   Please follow up if your symptoms worsen or fail to improve.  Attached with your AVS, you will find valuable resources for self-education. I highly recommend dedicating some time to thoroughly examine them.   Please continue to a heart-healthy diet and increase your physical activities. Try to exercise for at least five days a week.    It was a pleasure to see you and I look forward to continuing to work together on your health and well-being. Please do not hesitate to call the office if you need care or have questions about your care.  In case of emergency, please visit the Emergency Department for urgent care, or contact our clinic at [phone] to schedule an appointment. We're here to help you!   Have a wonderful day and week. With Gratitude, Symphani Eckstrom MSN, FNP-BC

## 2024-07-01 NOTE — Assessment & Plan Note (Signed)
-  Encouraged to take OTC Pepto-Bismol for stomach upset. -Continue Zofran  as needed for nausea. -If fever develops, you may take Tylenol (acetaminophen).  Lifestyle & Supportive Care: -Increase fluid intake (water, clear fluids, electrolyte drinks). -Allow for plenty of rest to help the body recover. -Viral illnesses are usually self-limiting. -Monitor for worsening symptoms such as persistent high fever, severe dehydration, chest pain, or shortness of breath -- seek medical care if these occur.

## 2024-07-01 NOTE — Assessment & Plan Note (Addendum)
-  Start Zanaflex  (tizanidine ) 4 mg by mouth at bedtime. -Take Ibuprofen  400 mg every 6 hours as needed for pain and inflammation (with food to protect stomach).  Non-Pharmacological Interventions: -Apply ice packs to the affected area during the first 48 hours, then switch to heat as needed. -Perform gentle stretching exercises as tolerated. -Avoid heavy lifting or strenuous activities until symptoms improve. -Use proper posture and body mechanics when sitting, standing, or lifting.

## 2024-07-02 ENCOUNTER — Other Ambulatory Visit: Payer: Self-pay

## 2024-07-02 ENCOUNTER — Other Ambulatory Visit (HOSPITAL_COMMUNITY): Payer: Self-pay

## 2024-07-02 ENCOUNTER — Telehealth: Payer: Self-pay | Admitting: Pharmacy Technician

## 2024-07-02 MED ORDER — TIZANIDINE HCL 4 MG PO TABS: 4.0000 mg | ORAL_TABLET | Freq: Every day | ORAL | 0 refills | Status: AC

## 2024-07-02 NOTE — Telephone Encounter (Signed)
 Pharmacy Patient Advocate Encounter   Received notification from CoverMyMeds that prior authorization for tiZANidine  HCl 4MG  capsules is required/requested.   Insurance verification completed.   The patient is insured through [facility] .   Per test claim:  Tizanidine  tablets is preferred by the insurance.  If suggested medication is appropriate, Please send in a new RX and discontinue this one. If not, please advise as to why it's not appropriate so that we may request a Prior Authorization. Please note, some preferred medications may still require a PA.  If the suggested medications have not been trialed and there are no contraindications to their use, the PA will not be submitted, as it will not be approved.

## 2024-07-02 NOTE — Telephone Encounter (Signed)
Tablets sent instead of capsules.

## 2024-07-03 LAB — COVID-19, FLU A+B AND RSV
Influenza A, NAA: NOT DETECTED
Influenza B, NAA: NOT DETECTED
RSV, NAA: NOT DETECTED
SARS-CoV-2, NAA: DETECTED — AB

## 2024-07-05 ENCOUNTER — Ambulatory Visit: Payer: Self-pay | Admitting: Family Medicine

## 2024-07-05 NOTE — Progress Notes (Signed)
 Please inform the patient that he tested positive for COVID-19. I recommend supportive care, including rest, adequate hydration, and over-the-counter medications for symptom relief as needed. I also recommend isolation from others for at least 5 days from symptom onset (or from the positive test if asymptomatic), and wearing a mask around others for an additional 5 days if symptoms are improving and he is fever-free for 24 hours without the use of fever-reducing medications.

## 2024-07-30 ENCOUNTER — Encounter: Payer: Self-pay | Admitting: *Deleted

## 2024-08-25 ENCOUNTER — Encounter: Payer: BC Managed Care – PPO | Admitting: Family Medicine

## 2024-08-25 ENCOUNTER — Encounter: Admitting: Nurse Practitioner

## 2024-11-11 ENCOUNTER — Encounter: Payer: Self-pay | Admitting: Gastroenterology
